# Patient Record
Sex: Female | Born: 1980 | Race: Black or African American | Hispanic: No | Marital: Single | Smoking: Never smoker
Health system: Southern US, Community
[De-identification: ages and names within clinical notes are randomized; demographics above are authoritative.]

## PROBLEM LIST (undated history)

## (undated) DIAGNOSIS — E785 Hyperlipidemia, unspecified: Secondary | ICD-10-CM

## (undated) DIAGNOSIS — F411 Generalized anxiety disorder: Secondary | ICD-10-CM

## (undated) DIAGNOSIS — G43009 Migraine without aura, not intractable, without status migrainosus: Secondary | ICD-10-CM

## (undated) DIAGNOSIS — O24419 Gestational diabetes mellitus in pregnancy, unspecified control: Secondary | ICD-10-CM

## (undated) DIAGNOSIS — F329 Major depressive disorder, single episode, unspecified: Secondary | ICD-10-CM

## (undated) DIAGNOSIS — T8859XA Other complications of anesthesia, initial encounter: Secondary | ICD-10-CM

## (undated) DIAGNOSIS — E669 Obesity, unspecified: Secondary | ICD-10-CM

## (undated) DIAGNOSIS — R51 Headache: Secondary | ICD-10-CM

## (undated) HISTORY — PX: PILONIDAL CYST EXCISION: SHX744

## (undated) HISTORY — PX: GANGLION CYST EXCISION: SHX1691

## (undated) HISTORY — DX: Hyperlipidemia, unspecified: E78.5

## (undated) HISTORY — DX: Obesity, unspecified: E66.9

## (undated) HISTORY — DX: Other complications of anesthesia, initial encounter: T88.59XA

## (undated) HISTORY — PX: CARPAL TUNNEL RELEASE: SHX101

## (undated) HISTORY — DX: Major depressive disorder, single episode, unspecified: F32.9

## (undated) HISTORY — DX: Generalized anxiety disorder: F41.1

## (undated) HISTORY — DX: Gestational diabetes mellitus in pregnancy, unspecified control: O24.419

## (undated) HISTORY — DX: Headache: R51

## (undated) HISTORY — DX: Migraine without aura, not intractable, without status migrainosus: G43.009

---

## 1998-05-19 ENCOUNTER — Other Ambulatory Visit: Admission: RE | Admit: 1998-05-19 | Discharge: 1998-05-19 | Payer: Self-pay | Admitting: Gynecology

## 1998-11-11 ENCOUNTER — Other Ambulatory Visit: Admission: RE | Admit: 1998-11-11 | Discharge: 1998-11-11 | Payer: Self-pay | Admitting: Gynecology

## 1999-03-23 ENCOUNTER — Other Ambulatory Visit: Admission: RE | Admit: 1999-03-23 | Discharge: 1999-03-23 | Payer: Self-pay | Admitting: Gynecology

## 1999-10-19 ENCOUNTER — Other Ambulatory Visit: Admission: RE | Admit: 1999-10-19 | Discharge: 1999-10-19 | Payer: Self-pay | Admitting: Gynecology

## 1999-12-08 ENCOUNTER — Other Ambulatory Visit: Admission: RE | Admit: 1999-12-08 | Discharge: 1999-12-08 | Payer: Self-pay | Admitting: Gynecology

## 1999-12-08 ENCOUNTER — Encounter (INDEPENDENT_AMBULATORY_CARE_PROVIDER_SITE_OTHER): Payer: Self-pay | Admitting: Specialist

## 2000-05-30 ENCOUNTER — Other Ambulatory Visit: Admission: RE | Admit: 2000-05-30 | Discharge: 2000-05-30 | Payer: Self-pay | Admitting: Gynecology

## 2003-02-11 ENCOUNTER — Encounter: Payer: Self-pay | Admitting: Emergency Medicine

## 2003-02-11 ENCOUNTER — Emergency Department (HOSPITAL_COMMUNITY): Admission: EM | Admit: 2003-02-11 | Discharge: 2003-02-11 | Payer: Self-pay | Admitting: Emergency Medicine

## 2003-11-22 ENCOUNTER — Emergency Department (HOSPITAL_COMMUNITY): Admission: EM | Admit: 2003-11-22 | Discharge: 2003-11-22 | Payer: Self-pay | Admitting: Emergency Medicine

## 2004-02-06 ENCOUNTER — Encounter: Payer: Self-pay | Admitting: Internal Medicine

## 2004-02-06 LAB — CONVERTED CEMR LAB

## 2004-02-13 ENCOUNTER — Ambulatory Visit (HOSPITAL_COMMUNITY): Admission: RE | Admit: 2004-02-13 | Discharge: 2004-02-13 | Payer: Self-pay | Admitting: Internal Medicine

## 2004-12-01 ENCOUNTER — Emergency Department (HOSPITAL_COMMUNITY): Admission: EM | Admit: 2004-12-01 | Discharge: 2004-12-01 | Payer: Self-pay | Admitting: Emergency Medicine

## 2005-05-16 ENCOUNTER — Ambulatory Visit: Payer: Self-pay | Admitting: Internal Medicine

## 2005-09-26 ENCOUNTER — Other Ambulatory Visit: Admission: RE | Admit: 2005-09-26 | Discharge: 2005-09-26 | Payer: Self-pay | Admitting: Gynecology

## 2006-02-20 ENCOUNTER — Ambulatory Visit: Payer: Self-pay | Admitting: Internal Medicine

## 2006-03-16 ENCOUNTER — Ambulatory Visit: Payer: Self-pay | Admitting: Internal Medicine

## 2006-04-26 ENCOUNTER — Ambulatory Visit: Payer: Self-pay | Admitting: Internal Medicine

## 2006-07-13 ENCOUNTER — Ambulatory Visit: Payer: Self-pay | Admitting: Internal Medicine

## 2006-10-18 ENCOUNTER — Ambulatory Visit: Payer: Self-pay | Admitting: Internal Medicine

## 2007-03-21 DIAGNOSIS — G43009 Migraine without aura, not intractable, without status migrainosus: Secondary | ICD-10-CM

## 2007-03-21 DIAGNOSIS — B019 Varicella without complication: Secondary | ICD-10-CM | POA: Insufficient documentation

## 2007-03-21 HISTORY — DX: Migraine without aura, not intractable, without status migrainosus: G43.009

## 2007-03-24 ENCOUNTER — Encounter: Payer: Self-pay | Admitting: Internal Medicine

## 2007-03-24 DIAGNOSIS — Z872 Personal history of diseases of the skin and subcutaneous tissue: Secondary | ICD-10-CM | POA: Insufficient documentation

## 2007-03-24 DIAGNOSIS — F411 Generalized anxiety disorder: Secondary | ICD-10-CM | POA: Insufficient documentation

## 2007-03-24 DIAGNOSIS — F329 Major depressive disorder, single episode, unspecified: Secondary | ICD-10-CM

## 2007-03-24 DIAGNOSIS — F3289 Other specified depressive episodes: Secondary | ICD-10-CM

## 2007-03-24 DIAGNOSIS — L0591 Pilonidal cyst without abscess: Secondary | ICD-10-CM | POA: Insufficient documentation

## 2007-03-24 DIAGNOSIS — M545 Low back pain: Secondary | ICD-10-CM

## 2007-03-24 DIAGNOSIS — F32A Depression, unspecified: Secondary | ICD-10-CM | POA: Insufficient documentation

## 2007-03-24 HISTORY — DX: Other specified depressive episodes: F32.89

## 2007-03-24 HISTORY — DX: Generalized anxiety disorder: F41.1

## 2007-03-24 HISTORY — DX: Major depressive disorder, single episode, unspecified: F32.9

## 2007-04-12 ENCOUNTER — Encounter: Payer: Self-pay | Admitting: Internal Medicine

## 2007-04-12 ENCOUNTER — Ambulatory Visit: Payer: Self-pay | Admitting: Internal Medicine

## 2007-04-12 DIAGNOSIS — L732 Hidradenitis suppurativa: Secondary | ICD-10-CM

## 2007-11-13 ENCOUNTER — Ambulatory Visit: Payer: Self-pay | Admitting: Internal Medicine

## 2007-11-13 DIAGNOSIS — R079 Chest pain, unspecified: Secondary | ICD-10-CM

## 2007-11-13 DIAGNOSIS — R209 Unspecified disturbances of skin sensation: Secondary | ICD-10-CM | POA: Insufficient documentation

## 2007-11-13 DIAGNOSIS — R21 Rash and other nonspecific skin eruption: Secondary | ICD-10-CM

## 2007-11-13 LAB — CONVERTED CEMR LAB
BUN: 9 mg/dL (ref 6–23)
CO2: 29 meq/L (ref 19–32)
Calcium: 9.3 mg/dL (ref 8.4–10.5)
Chloride: 105 meq/L (ref 96–112)
Creatinine, Ser: 0.6 mg/dL (ref 0.4–1.2)
Folate: 8.2 ng/mL
TSH: 2.45 microintl units/mL (ref 0.35–5.50)

## 2007-11-14 ENCOUNTER — Encounter: Payer: Self-pay | Admitting: Internal Medicine

## 2007-11-19 ENCOUNTER — Ambulatory Visit: Payer: Self-pay | Admitting: Internal Medicine

## 2008-01-10 ENCOUNTER — Ambulatory Visit: Payer: Self-pay | Admitting: Internal Medicine

## 2008-03-24 ENCOUNTER — Ambulatory Visit: Payer: Self-pay | Admitting: Internal Medicine

## 2008-04-21 ENCOUNTER — Telehealth: Payer: Self-pay | Admitting: Internal Medicine

## 2009-01-23 ENCOUNTER — Ambulatory Visit: Payer: Self-pay | Admitting: Internal Medicine
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
## 2009-06-11 ENCOUNTER — Emergency Department (HOSPITAL_COMMUNITY): Admission: EM | Admit: 2009-06-11 | Discharge: 2009-06-11 | Payer: Self-pay | Admitting: Emergency Medicine

## 2009-07-30 ENCOUNTER — Telehealth: Payer: Self-pay | Admitting: Internal Medicine

## 2009-07-30 ENCOUNTER — Ambulatory Visit: Payer: Self-pay | Admitting: Internal Medicine

## 2009-07-30 LAB — CONVERTED CEMR LAB
Basophils Absolute: 0 10*3/uL (ref 0.0–0.1)
Basophils Relative: 0.4 % (ref 0.0–3.0)
Eosinophils Absolute: 0.1 10*3/uL (ref 0.0–0.7)
HCT: 37.6 % (ref 36.0–46.0)
Hemoglobin: 12.6 g/dL (ref 12.0–15.0)
Lymphs Abs: 2.3 10*3/uL (ref 0.7–4.0)
MCHC: 33.5 g/dL (ref 30.0–36.0)
MCV: 86.7 fL (ref 78.0–100.0)
Monocytes Absolute: 0.7 10*3/uL (ref 0.1–1.0)
Neutro Abs: 3.2 10*3/uL (ref 1.4–7.7)
RBC: 4.34 M/uL (ref 3.87–5.11)
RDW: 11.9 % (ref 11.5–14.6)

## 2009-08-03 ENCOUNTER — Encounter: Payer: Self-pay | Admitting: Internal Medicine

## 2009-12-15 ENCOUNTER — Ambulatory Visit: Payer: Self-pay | Admitting: Internal Medicine

## 2009-12-15 DIAGNOSIS — E66812 Obesity, class 2: Secondary | ICD-10-CM | POA: Insufficient documentation

## 2009-12-15 DIAGNOSIS — E669 Obesity, unspecified: Secondary | ICD-10-CM

## 2010-09-07 NOTE — Assessment & Plan Note (Signed)
Summary: SORE THROAT/ EAR PAIN /NWS   Vital Signs:  Patient profile:   30 year old female Height:      67 inches Weight:      220 pounds BMI:     34.58 O2 Sat:      97 % on Room air Temp:     98.2 degrees F oral Pulse rate:   93 / minute BP sitting:   100 / 70  (left arm) Cuff size:   regular  Vitals Entered By: Bill Salinas CMA (Dec 15, 2009 4:12 PM)  O2 Flow:  Room air CC: pt here with complaint of sore throat and pain in both ears, but states the pain is worse in her right ear. Pt also has had some nasal drainage/ ab   Primary Care Provider:  [facility]  CC:  pt here with complaint of sore throat and pain in both ears and but states the pain is worse in her right ear. Pt also has had some nasal drainage/ ab.  History of Present Illness: Patient presents for 1 week h/o sore throat, ear pain bilaterally worse on the right. She has not had chills or documented fever. Nasal drainage is clear but she has had greenish Wiersma sputum.  No hearing loss but she will  have mild tinnintis. She has been taking allergy medicine - possibly with a decongestant.  She has been trying to loose weight with modified diet and walking. But, she has gained 10 lbs since Feb.  She has several small, 2mm pustules at the waist line and the inner thighs.   Current Medications (verified): 1)  Aleve 220 Mg  Tabs (Naproxen Sodium) .... Take 2 Tablet By Mouth Once A Day As Needed 2)  Meloxicam 15 Mg  Tabs (Meloxicam) .Marland Kitchen.. 1 By Mouth Daily For Back and Neck Problems  Allergies (verified): No Known Drug Allergies  Past History:  Past Medical History: Last updated: 03/24/2007 Headache Chicken Pox Anxiety Depression Low back pain  Past Surgical History: Last updated: 03/24/2007 S/P Pilonidal Cyst S/P Ganglion Cyst, L Wrist  Family History: Last updated: 11/13/2007 mother - '47: back surgery, DM, HTN, Lipids, CAD father - '44: HTN, Lipids, CAD PGM- breast cancer Neg- colon cancer  Social  History: Last updated: 11/13/2007 HSG, 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  The patient denies anorexia, fever, weight loss, chest pain, dyspnea on exertion, peripheral edema, prolonged cough, hemoptysis, abdominal pain, hematochezia, incontinence, suspicious skin lesions, difficulty walking, unusual weight change, abnormal bleeding, and angioedema.    Physical Exam  General:  overweight AA female in no distress Head:  no sinus tenderness to percussion. Tender to palpation over the right TMJ. Eyes:  corneas and lenses clear and no injection.   Ears:  EACs TMs normal Mouth:  posterior pharynx clear Neck:  supple and no thyromegaly.   Lungs:  normal respiratory effort, normal breath sounds, no crackles, and no wheezes.   Heart:  normal rate, regular rhythm, no murmur, no gallop, and no JVD.   Msk:  normal ROM, no joint tenderness, and no joint swelling.   Pulses:  2+ radial pulses Neurologic:  alert & oriented X3, cranial nerves II-XII intact, strength normal in all extremities, and gait normal.     Impression & Recommendations:  Problem # 1:  SKIN RASH (ICD-782.1) Probable folliculitis   Plan - use of anti-bacterial soap  cover small pustules with bandaid to prevent abrasion  Problem # 2:  VIRAL URI (ICD-465.9) No evidence of bacterial infection.  Pln - supportive care         otc anithistamines  see pt instr.  Her updated medication list for this problem includes:    Aleve 220 Mg Tabs (Naproxen sodium) .Marland Kitchen... Take 2 tablet by mouth once a day as needed    Meloxicam 15 Mg Tabs (Meloxicam) .Marland Kitchen... 1 by mouth daily for back and neck problems  Problem # 3:  OVERWEIGHT (ICD-278.02) Patient c/o inability to loose weight. She does drink sweetened beverages, carbs.  Plan - weight management with smart food choices- no sweetened drinks, low carbs; portion size;  continued exercise.            She may keep a food diary and perform a 5 day calorie count.           goal - 1 lb weight loss per month.  Complete Medication List: 1)  Aleve 220 Mg Tabs (Naproxen sodium) .... Take 2 tablet by mouth once a day as needed 2)  Meloxicam 15 Mg Tabs (Meloxicam) .Marland Kitchen.. 1 by mouth daily for back and neck problems  Patient Instructions: 1)  Viral Upper respiratory infection with pressure in the sinuses and drainage. No need for antibiotics. Plan - loratadine 10mg  once a day; generic sudafed 30mg  three times a day; gargle of choice. 2)  Ear pain right - tender over the temporal-mandibular joint (TMJ). Plan - small bites, don't chew gum; use aspercreme over the outside of the joint; motrin or aleve as directed. 3)  Folliculitis - small infected hair follicules where there is chaffing. Plan - warm soaks or baths washing with antibacterial soap and washcloth. Cover the small putules with a spot bandaid to prevent chaffing. For inner thighs - cotton clothins, minimize chaffing. 4)  Weight management - smart food choice, portion size control - use your hand, regular exercise. For extra precision - do a calorie count. Also  - no sweetened beverages.

## 2012-02-17 ENCOUNTER — Encounter (HOSPITAL_BASED_OUTPATIENT_CLINIC_OR_DEPARTMENT_OTHER): Payer: Self-pay | Admitting: Emergency Medicine

## 2012-02-17 ENCOUNTER — Emergency Department (HOSPITAL_BASED_OUTPATIENT_CLINIC_OR_DEPARTMENT_OTHER)
Admission: EM | Admit: 2012-02-17 | Discharge: 2012-02-17 | Disposition: A | Discharge status: Home / Self Care | Payer: Self-pay | Attending: Emergency Medicine | Admitting: Emergency Medicine

## 2012-02-17 DIAGNOSIS — Z3009 Encounter for other general counseling and advice on contraception: Secondary | ICD-10-CM | POA: Insufficient documentation

## 2012-02-17 DIAGNOSIS — T192XXA Foreign body in vulva and vagina, initial encounter: Secondary | ICD-10-CM

## 2012-02-17 NOTE — ED Provider Notes (Signed)
History     CSN: [id number]  Arrival date & time 02/17/12  4098   First MD Initiated Contact with Patient 02/17/12 [phone]      Chief Complaint  Patient presents with  . Foreign Body in Vagina    (Consider location/radiation/quality/duration/timing/severity/associated sxs/prior treatment) HPI Pt reports she inserted a contraceptive sponge in her vagina approx 48hrs ago and was unable to remove it yesterday. Complaining of some vaginal bleeding and soreness but no fever, discharge or vomiting.  History reviewed. No pertinent past medical history.  History reviewed. No pertinent past surgical history.  No family history on file.  History  Substance Use Topics  . Smoking status: Never Smoker   . Smokeless tobacco: Not on file  . Alcohol Use: No    OB History    Grav Para Term Preterm Abortions TAB SAB Ect Mult Living                  Review of Systems All other systems reviewed and are negative except as noted in HPI.   Allergies  Review of patient's allergies indicates no known allergies.  Home Medications  No current outpatient prescriptions on file.  BP 133/83  Pulse 88  Temp 98 F (36.7 C) (Oral)  Resp 18  SpO2 100%  Physical Exam  Nursing note and vitals reviewed. Constitutional: She is oriented to person, place, and time. She appears well-developed and well-nourished.  HENT:  Head: Normocephalic and atraumatic.  Eyes: EOM are normal. Pupils are equal, round, and reactive to light.  Neck: Normal range of motion. Neck supple.  Cardiovascular: Normal rate, normal heart sounds and intact distal pulses.   Pulmonary/Chest: Effort normal and breath sounds normal.  Abdominal: Bowel sounds are normal. She exhibits no distension. There is no tenderness.  Genitourinary: No bleeding around the vagina. There is a foreign body around the vagina. Vaginal discharge found.  Musculoskeletal: Normal range of motion. She exhibits no edema and no tenderness.  Neurological:  She is alert and oriented to person, place, and time. She has normal strength. No cranial nerve deficit or sensory deficit.  Skin: Skin is warm and dry. No rash noted.  Psychiatric: She has a normal mood and affect.    ED Course  Procedures (including critical care time)  Labs Reviewed - No data to display No results found.   No diagnosis found.    MDM  Sponge foreign body removed from vagina with ring forceps, will hold off on antibiotics since foreign body was removed intact. Advised to return to the ED for worsening pain, bleeding, discharge fever or for any other concerns.        Charles B. Bernette Mayers, MD 02/17/12 4782

## 2012-02-17 NOTE — ED Notes (Signed)
Foreign body removed from vagina by Dr. Bernette Mayers intact.

## 2012-02-17 NOTE — ED Notes (Signed)
Pt has a Today's Sponge in vagina and is unable to remove

## 2012-03-30 ENCOUNTER — Ambulatory Visit (INDEPENDENT_AMBULATORY_CARE_PROVIDER_SITE_OTHER): Payer: Self-pay | Admitting: Internal Medicine

## 2012-03-30 ENCOUNTER — Encounter: Payer: Self-pay | Admitting: Internal Medicine

## 2012-03-30 VITALS — BP 100/68 | HR 94 | Temp 97.1°F | Ht 67.0 in | Wt 235.0 lb

## 2012-03-30 DIAGNOSIS — J019 Acute sinusitis, unspecified: Secondary | ICD-10-CM

## 2012-03-30 MED ORDER — CEPHALEXIN 500 MG PO CAPS: 500.0000 mg | ORAL_CAPSULE | Freq: Four times a day (QID) | ORAL | Status: AC

## 2012-03-30 MED ORDER — HYDROCODONE-HOMATROPINE 5-1.5 MG/5ML PO SYRP: 5.0000 mL | ORAL_SOLUTION | Freq: Four times a day (QID) | ORAL | Status: AC | PRN

## 2012-03-30 NOTE — Patient Instructions (Addendum)
Take all new medications as prescribed Continue all other medications as before You can also take  Mucinex (or it's generic off brand) for congestion  

## 2012-03-30 NOTE — Assessment & Plan Note (Signed)
Mild to mod, for antibx course,  to f/u any worsening symptoms or concerns 

## 2012-03-30 NOTE — Progress Notes (Signed)
  Subjective:    Patient ID: Kathleen Hendricks, female    DOB: [birth year], 31 y.o.   MRN: [id number]  HPI   Here with 3 days acute onset fever, facial pain, pressure, general weakness and malaise, and greenish d/c, with slight ST, but with signfiacnt non prod cough and Pt denies chest pain, increased sob or doe, wheezing, orthopnea, PND, increased LE swelling, palpitations, dizziness or syncope.   Past Medical History  Diagnosis Date  . DEPRESSION 03/24/2007    Qualifier: Diagnosis of  By: Jonny Ruiz MD, Len Blalock   . ANXIETY 03/24/2007    Qualifier: Diagnosis of  By: Jonny Ruiz MD, Len Blalock    No past surgical history on file.  reports that she has never smoked. She does not have any smokeless tobacco history on file. She reports that she does not drink alcohol or use illicit drugs. family history is not on file. No Known Allergies No current outpatient prescriptions on file prior to visit.   Review of Systems All otherwise neg per pt     Objective:   Physical Exam BP 100/68  Pulse 94  Temp 97.1 F (36.2 C) (Oral)  Ht 5\' 7"  (1.702 m)  Wt 235 lb (106.595 kg)  BMI 36.81 kg/m2  SpO2 99% Physical Exam  VS noted Constitutional: Pt appears well-developed and well-nourished.  HENT: Head: Normocephalic.  Right Ear: External ear normal.  Left Ear: External ear normal.  Bilat tm's mild erythema.  Sinus tender.  Pharynx mild erythema Eyes: Conjunctivae and EOM are normal. Pupils are equal, round, and reactive to light.  Neck: Normal range of motion. Neck supple.  Cardiovascular: Normal rate and regular rhythm.   Pulmonary/Chest: Effort normal and breath sounds normal.  Skin: Skin is warm. No erythema.  Psychiatric:1+ nervou    Assessment & Plan:

## 2012-06-05 ENCOUNTER — Ambulatory Visit (INDEPENDENT_AMBULATORY_CARE_PROVIDER_SITE_OTHER): Payer: Self-pay | Admitting: Internal Medicine

## 2012-06-05 ENCOUNTER — Encounter: Payer: Self-pay | Admitting: Internal Medicine

## 2012-06-05 VITALS — BP 100/70 | HR 86 | Temp 98.0°F | Resp 16 | Wt 228.0 lb

## 2012-06-05 DIAGNOSIS — Z0000 Encounter for general adult medical examination without abnormal findings: Secondary | ICD-10-CM

## 2012-06-05 DIAGNOSIS — E669 Obesity, unspecified: Secondary | ICD-10-CM

## 2012-06-05 DIAGNOSIS — L300 Nummular dermatitis: Secondary | ICD-10-CM

## 2012-06-05 DIAGNOSIS — L259 Unspecified contact dermatitis, unspecified cause: Secondary | ICD-10-CM

## 2012-06-05 DIAGNOSIS — Z0001 Encounter for general adult medical examination with abnormal findings: Secondary | ICD-10-CM | POA: Insufficient documentation

## 2012-06-05 MED ORDER — CLOTRIMAZOLE-BETAMETHASONE 1-0.05 % EX CREA: TOPICAL_CREAM | Freq: Two times a day (BID) | CUTANEOUS | Status: DC

## 2012-06-05 NOTE — Progress Notes (Signed)
  Subjective:    Patient ID: Kathleen Hendricks, female    DOB: 1980-08-31, 31 y.o.   MRN: [id number]  HPI Kathleen Hendricks presents for evaluation of a patchy, dry, pruritic rash on her abdomen and thighs. This is a chronic problem. She has tried soaps and alcohol and other OTC treatments. The rash is a little large and darker on her thighs. There has been no drainage. No significant family  h/o eczema, asthma, atopic dermatitis, diabetes.  Past Medical History  Diagnosis Date  . DEPRESSION 03/24/2007    Qualifier: Diagnosis of  By: Kathleen Ruiz MD, Kathleen Hendricks   . ANXIETY 03/24/2007    Qualifier: Diagnosis of  By: Kathleen Ruiz MD, Kathleen Hendricks   . Headache   . Obesity (BMI 35.0-39.9 without comorbidity)    Past Surgical History  Procedure Date  . Ganglion cyst excision     left wrist  . Pilonidal cyst excision    Family History  Problem Relation Age of Onset  . Diabetes Mother   . Hypertension Mother   . Hyperlipidemia Mother   . Heart disease Mother     CAD  . Arthritis Mother     back surgery  . Hypertension Father   . Hyperlipidemia Father   . Heart disease Father     CAD  . Cancer Paternal Grandmother     breast   History   Social History  . Marital Status: Single    Spouse Name: N/A    Number of Children: N/A  . Years of Education: 51   Occupational History  . Not on file.   Social History Main Topics  . Smoking status: Never Smoker   . Smokeless tobacco: Never Used  . Alcohol Use: No  . Drug Use: No  . Sexually Active: Not on file   Other Topics Concern  . Not on file   Social History Narrative   HSG, [employer] - BA. Some grad school.  Single. Lives alone. Works - [employer]   No current outpatient prescriptions on file prior to visit.       Review of Systems System review is negative for any constitutional, cardiac, pulmonary, GI or neuro symptoms or complaints other than as described in the HPI.     Objective:   Physical Exam Filed Vitals:   06/05/12 1640   BP: 100/70  Pulse: 86  Temp: 98 F (36.7 C)  Resp: 16   Wt Readings from Last 3 Encounters:  06/05/12 228 lb (103.42 kg)  03/30/12 235 lb (106.595 kg)  12/15/09 220 lb (99.791 kg)   Gen'l- overweight white AA woman in no distress HEENT- C&S clear Cor- RRR Pulm - normal respirations Derm - abdomen with multiple 1 to 2 cm dark, dry patches without exfoliation. Thighs not examined.        Assessment & Plan:  Nummular eczema vs tinea corporis  Plan - lotrisone cream applied twice a day  For lack of response - biopsy vs derm consult

## 2012-06-05 NOTE — Patient Instructions (Addendum)
Skin rash - nummular eczema vs a fungal infection of the skin.   Plan - generic Lotrisone cream (combination of antifungal and steroid) - apply just a dab to each lesion twice a day.   If no improvement can either do a biopsy or refer to dermatology.    Health maintenance - orders are in for lab work. Come by any morning fasting for blood draw. Lab opens at 7:30

## 2012-06-05 NOTE — Assessment & Plan Note (Signed)
Patient is working on [employer] and has lost 17 lbs over the past year. She is following a low calorie diet with portion size control.  Plan - To keep up the good work.

## 2012-06-05 NOTE — Assessment & Plan Note (Signed)
She reports she is current with Gyn. Last labs in 2010  Plan  Routine lab: lipid, Bmet, CBC, hepatic.

## 2012-06-14 ENCOUNTER — Other Ambulatory Visit (INDEPENDENT_AMBULATORY_CARE_PROVIDER_SITE_OTHER): Payer: Self-pay

## 2012-06-14 DIAGNOSIS — Z0000 Encounter for general adult medical examination without abnormal findings: Secondary | ICD-10-CM

## 2012-06-14 LAB — LIPID PANEL
Cholesterol: 176 mg/dL (ref 0–200)
LDL Cholesterol: 127 mg/dL — ABNORMAL HIGH (ref 0–99)
Triglycerides: 43 mg/dL (ref 0.0–149.0)
VLDL: 8.6 mg/dL (ref 0.0–40.0)

## 2012-06-14 LAB — COMPREHENSIVE METABOLIC PANEL
ALT: 32 U/L (ref 0–35)
AST: 24 U/L (ref 0–37)
Albumin: 3.9 g/dL (ref 3.5–5.2)
Alkaline Phosphatase: 70 U/L (ref 39–117)
BUN: 7 mg/dL (ref 6–23)
CO2: 26 mEq/L (ref 19–32)
Calcium: 9.2 mg/dL (ref 8.4–10.5)
Chloride: 105 mEq/L (ref 96–112)
Creatinine, Ser: 0.7 mg/dL (ref 0.4–1.2)
GFR: 123.42 mL/min (ref >60.00)
Glucose, Bld: 87 mg/dL (ref 70–99)
Potassium: 4 mEq/L (ref 3.5–5.1)
Sodium: 138 mEq/L (ref 135–145)
Total Bilirubin: 0.6 mg/dL (ref 0.3–1.2)
Total Protein: 7.5 g/dL (ref 6.0–8.3)

## 2012-06-14 LAB — HEPATIC FUNCTION PANEL
Alkaline Phosphatase: 70 U/L (ref 39–117)
Bilirubin, Direct: 0.1 mg/dL (ref 0.0–0.3)
Total Bilirubin: 0.6 mg/dL (ref 0.3–1.2)
Total Protein: 7.5 g/dL (ref 6.0–8.3)

## 2012-06-14 LAB — CBC WITH DIFFERENTIAL/PLATELET
Basophils Absolute: 0 10*3/uL (ref 0.0–0.1)
Eosinophils Relative: 3.4 % (ref 0.0–5.0)
HCT: 39.5 % (ref 36.0–46.0)
Hemoglobin: 13.1 g/dL (ref 12.0–15.0)
MCHC: 33.3 g/dL (ref 30.0–36.0)
MCV: 85.4 fl (ref 78.0–100.0)
Monocytes Relative: 10.2 % (ref 3.0–12.0)
RBC: 4.62 Mil/uL (ref 3.87–5.11)

## 2012-06-18 ENCOUNTER — Encounter: Payer: Self-pay | Admitting: Internal Medicine

## 2012-09-28 ENCOUNTER — Encounter: Payer: Self-pay | Admitting: Internal Medicine

## 2012-09-28 ENCOUNTER — Ambulatory Visit (INDEPENDENT_AMBULATORY_CARE_PROVIDER_SITE_OTHER): Payer: Self-pay | Admitting: Internal Medicine

## 2012-09-28 VITALS — BP 108/68 | HR 85 | Temp 99.2°F | Ht 67.0 in | Wt 216.8 lb

## 2012-09-28 DIAGNOSIS — J069 Acute upper respiratory infection, unspecified: Secondary | ICD-10-CM

## 2012-09-28 DIAGNOSIS — B49 Unspecified mycosis: Secondary | ICD-10-CM

## 2012-09-28 DIAGNOSIS — T3695XA Adverse effect of unspecified systemic antibiotic, initial encounter: Secondary | ICD-10-CM

## 2012-09-28 DIAGNOSIS — R05 Cough: Secondary | ICD-10-CM

## 2012-09-28 MED ORDER — HYDROCODONE-HOMATROPINE 5-1.5 MG/5ML PO SYRP: 5.0000 mL | ORAL_SOLUTION | Freq: Three times a day (TID) | ORAL | Status: DC | PRN

## 2012-09-28 MED ORDER — AZITHROMYCIN 250 MG PO TABS: ORAL_TABLET | ORAL | Status: DC

## 2012-09-28 MED ORDER — FLUCONAZOLE 150 MG PO TABS: 150.0000 mg | ORAL_TABLET | Freq: Once | ORAL | Status: DC

## 2012-09-28 NOTE — Patient Instructions (Signed)

## 2012-09-28 NOTE — Progress Notes (Signed)
HPI  Pt presents to the clinic today with c/o cold symptoms x 2 weeks. The worst part is the sore throat and dry cough. She does not produce any sputum. She has ran fevers. She has tried Robitussin, Mucinex, cough drops and nothing seems to help. The cough is worse at night. She has not had much sleep in 2 nights. She does have a history of allergies but no asthma. She does have sick contacts.  Review of Systems      Past Medical History  Diagnosis Date  . DEPRESSION 03/24/2007    Qualifier: Diagnosis of  By: Jonny Ruiz MD, Len Blalock   . ANXIETY 03/24/2007    Qualifier: Diagnosis of  By: Jonny Ruiz MD, Len Blalock   . Headache   . Obesity (BMI 35.0-39.9 without comorbidity)     Family History  Problem Relation Age of Onset  . Diabetes Mother   . Hypertension Mother   . Hyperlipidemia Mother   . Heart disease Mother     CAD  . Arthritis Mother     back surgery  . Hypertension Father   . Hyperlipidemia Father   . Heart disease Father     CAD  . Cancer Paternal Grandmother     breast    History   Social History  . Marital Status: Single    Spouse Name: N/A    Number of Children: N/A  . Years of Education: 76   Occupational History  . Not on file.   Social History Main Topics  . Smoking status: Never Smoker   . Smokeless tobacco: Never Used  . Alcohol Use: No  . Drug Use: No  . Sexually Active: Not on file   Other Topics Concern  . Not on file   Social History Narrative   HSG, [employer] - BA. Some grad school.  Single. Lives alone. Works - [employer]    No Known Allergies   Constitutional: Positive headache, fatigue and fever. Denies abrupt weight changes.  HEENT:  Positive sore throat. Denies eye redness, eye pain, pressure behind the eyes, facial pain, nasal congestion, ear pain, ringing in the ears, wax buildup, runny nose or bloody nose. Respiratory: Positive cough. Denies difficulty breathing or shortness of breath.  Cardiovascular: Denies chest pain,  chest tightness, palpitations or swelling in the hands or feet.   No other specific complaints in a complete review of systems (except as listed in HPI above).  Objective:   BP 108/68  Pulse 85  Temp(Src) 99.2 F (37.3 C) (Oral)  Ht 5\' 7"  (1.702 m)  Wt 216 lb 12 oz (98.317 kg)  BMI 33.94 kg/m2  SpO2 97%  LMP 09/05/2012 Wt Readings from Last 3 Encounters:  09/28/12 216 lb 12 oz (98.317 kg)  06/05/12 228 lb (103.42 kg)  03/30/12 235 lb (106.595 kg)     General: Appears her stated age, well developed, well nourished in NAD. HEENT: Head: normal shape and size; Eyes: sclera white, no icterus, conjunctiva pink, PERRLA and EOMs intact; Ears: Tm's gray and intact, distorted light reflex; Nose: mucosa pink and moist, septum midline; Throat/Mouth: + PND. Teeth present, mucosa erythematous and moist, no exudate noted, no lesions or ulcerations noted.  Neck: Mild cervical lymphadenopathy. Neck supple, trachea midline. No massses, lumps or thyromegaly present.  Cardiovascular: Normal rate and rhythm. S1,S2 noted.  No murmur, rubs or gallops noted. No JVD or BLE edema. No carotid bruits noted. Pulmonary/Chest: Normal effort and positive vesicular breath sounds. No respiratory distress. No  wheezes, rales or ronchi noted.      Assessment & Plan:   Upper Respiratory Infection, new onset with additional workup required:  Get some rest and drink plenty of water Do salt water gargles for the sore throat eRx for Azithromax x 5 days eRx for Hycodan cough syrup  Antibiotic induced yeast infection:  Will give RX for diflucan if needed  RTC as needed or if symptoms persist.

## 2013-07-30 ENCOUNTER — Ambulatory Visit (INDEPENDENT_AMBULATORY_CARE_PROVIDER_SITE_OTHER)
Admission: RE | Admit: 2013-07-30 | Discharge: 2013-07-30 | Disposition: A | Discharge status: Home / Self Care | Payer: BC Managed Care – PPO | Source: Ambulatory Visit | Attending: Internal Medicine | Admitting: Internal Medicine

## 2013-07-30 ENCOUNTER — Encounter: Payer: Self-pay | Admitting: Internal Medicine

## 2013-07-30 ENCOUNTER — Ambulatory Visit (INDEPENDENT_AMBULATORY_CARE_PROVIDER_SITE_OTHER): Payer: BC Managed Care – PPO | Admitting: Internal Medicine

## 2013-07-30 VITALS — BP 134/92 | HR 80 | Temp 98.1°F | Wt 224.8 lb

## 2013-07-30 DIAGNOSIS — R209 Unspecified disturbances of skin sensation: Secondary | ICD-10-CM

## 2013-07-30 DIAGNOSIS — R202 Paresthesia of skin: Secondary | ICD-10-CM

## 2013-07-30 DIAGNOSIS — M79601 Pain in right arm: Secondary | ICD-10-CM

## 2013-07-30 DIAGNOSIS — M545 Low back pain, unspecified: Secondary | ICD-10-CM

## 2013-07-30 DIAGNOSIS — M79609 Pain in unspecified limb: Secondary | ICD-10-CM

## 2013-07-30 NOTE — Patient Instructions (Signed)
1. Low back and leg pain - exam is normal without any evidence of a pinched nerve or spinal cord. Still need to worry about disk disease or pressure on the spinal cord.  Plan X-ray of the lumbar spine  If x-rays totally normal may need Neurology consults  2. Tingling and weakness in the arms, right more than left. Exam is negative for pinched nerve signs or carpal tunnel syndrom  Plan Neck x-rays looking for disk disease  If normal may need neurology consult  3. Headach - may be migraine variant  Plan Trial of migraine medication -  If it works it makes the diagnosis.

## 2013-07-30 NOTE — Progress Notes (Signed)
Pre visit review using our clinic review tool, if applicable. No additional management support is needed unless otherwise documented below in the visit note. 

## 2013-07-30 NOTE — Progress Notes (Signed)
Subjective:    Patient ID: Kathleen Hendricks, female    DOB: [birth year], 32 y.o.   MRN: [id number]  HPI Low back pain - off and on for over a year but worse over the last 6 months. Standing at the sink hurts, hurts in AM when getting out of bed. She has developed paresthesia of both legs when sitting for a prolonged period of time, i.e. Using the commode. No loss of strength. No injury or strain recollected.   She does report that when sleeping or laying down or reaching out she will have paresthesia and difficulty coordinating meaningful movement. It is bilateral but more right UE.Marland Kitchen She does have some left sided neck pain.   Her headaches are more frequent and more intense. The distribution is either left or right frontal area. No facial paresthesia, no double vision, no abnormal smells. No prodroma. Headache will awaken her from sleep. No vomiting but a little nausea. Intermittently responds to NSAIDs. Predominant feeling of head exploding. She has not use 5HT drug for years Past Medical History  Diagnosis Date  . DEPRESSION 03/24/2007    Qualifier: Diagnosis of  By: Jonny Ruiz MD, Len Blalock   . ANXIETY 03/24/2007    Qualifier: Diagnosis of  By: Jonny Ruiz MD, Danella Sensing)   . Obesity (BMI 35.0-39.9 without comorbidity)    Past Surgical History  Procedure Laterality Date  . Ganglion cyst excision      left wrist  . Pilonidal cyst excision     Family History  Problem Relation Age of Onset  . Diabetes Mother   . Hypertension Mother   . Hyperlipidemia Mother   . Heart disease Mother     CAD  . Arthritis Mother     back surgery  . Hypertension Father   . Hyperlipidemia Father   . Heart disease Father     CAD  . Cancer Paternal Grandmother     breast   History   Social History  . Marital Status: Single    Spouse Name: N/A    Number of Children: N/A  . Years of Education: 74   Occupational History  . Not on file.   Social History Main Topics  . Smoking status: Never  Smoker   . Smokeless tobacco: Never Used  . Alcohol Use: No  . Drug Use: No  . Sexual Activity: Not on file   Other Topics Concern  . Not on file   Social History Narrative   HSG, [employer] - BA. Some grad school.  Single. Lives alone. Works - [employer]    No current outpatient prescriptions on file prior to visit.   No current facility-administered medications on file prior to visit.      Review of Systems System review is negative for any constitutional, cardiac, pulmonary, GI or neuro symptoms or complaints other than as described in the HPI.     Objective:   Physical Exam Filed Vitals:   07/30/13 1128  BP: 134/92  Pulse: 80  Temp: 98.1 F (36.7 C)   Wt Readings from Last 3 Encounters:  07/30/13 224 lb 12.8 oz (101.969 kg)  09/28/12 216 lb 12 oz (98.317 kg)  06/05/12 228 lb (103.42 kg)   Gen'l- overweight woman in no acute distress HEENT - normal Cor- RRR Pulm - normal respirations Neuro - A&O x 3, speech clear, cognition normal. CN II-XII - normal facial symmetry, PERRLA EOMI, normal shrug. MS - 5/5/ grip and UE strength,  5/5 LE strength. DTRs - 1`+ at patellar tendon, 2+ at biceps. No tremor, normal joint movement. Normal sensation to light touch, pin-prick and vibration UE and LE. Negative Tinel's and Phalen's sigh. MSK. - Back exam: normal stand; normal flex to greater than 100 degrees; normal gait; normal toe/heel walk; normal step up to exam table; normal SLR sitting; normal DTRs at the patellar tendons;  no  CVA tenderness; able to move supine to sitting wihout assistance.  Cervical spine x-rays: FINDINGS:  There is no evidence of cervical spine fracture or prevertebral soft  tissue swelling. Alignment is normal. No other significant bone  abnormalities are identified.  IMPRESSION:  Negative cervical spine radiographs.  Lumbar spine x-rays: COMPARISON: 11/13/2007  FINDINGS:  There is no evidence of lumbar spine fracture. Alignment is  normal.  Intervertebral disc spaces are maintained.  IMPRESSION:  Negative.       Assessment & Plan:  Paresthesias - patient with LE and UE paresthesia with no focal findings and c-spine and lumbar spine films negative for DDD.  Plan Refer to Neurology for possible demyelinating disease vs other.

## 2014-04-02 ENCOUNTER — Encounter: Payer: Self-pay | Admitting: Internal Medicine

## 2014-04-02 ENCOUNTER — Other Ambulatory Visit (INDEPENDENT_AMBULATORY_CARE_PROVIDER_SITE_OTHER): Payer: BC Managed Care – PPO

## 2014-04-02 ENCOUNTER — Ambulatory Visit (INDEPENDENT_AMBULATORY_CARE_PROVIDER_SITE_OTHER): Payer: BC Managed Care – PPO | Admitting: Internal Medicine

## 2014-04-02 VITALS — BP 120/78 | HR 84 | Temp 98.4°F | Ht 67.0 in | Wt 225.5 lb

## 2014-04-02 DIAGNOSIS — G43009 Migraine without aura, not intractable, without status migrainosus: Secondary | ICD-10-CM

## 2014-04-02 DIAGNOSIS — Z0000 Encounter for general adult medical examination without abnormal findings: Secondary | ICD-10-CM

## 2014-04-02 DIAGNOSIS — M545 Low back pain, unspecified: Secondary | ICD-10-CM

## 2014-04-02 DIAGNOSIS — M25519 Pain in unspecified shoulder: Secondary | ICD-10-CM

## 2014-04-02 DIAGNOSIS — R5381 Other malaise: Secondary | ICD-10-CM

## 2014-04-02 DIAGNOSIS — L0292 Furuncle, unspecified: Secondary | ICD-10-CM

## 2014-04-02 DIAGNOSIS — E785 Hyperlipidemia, unspecified: Secondary | ICD-10-CM

## 2014-04-02 DIAGNOSIS — R5383 Other fatigue: Secondary | ICD-10-CM | POA: Insufficient documentation

## 2014-04-02 DIAGNOSIS — Z23 Encounter for immunization: Secondary | ICD-10-CM

## 2014-04-02 DIAGNOSIS — G43019 Migraine without aura, intractable, without status migrainosus: Secondary | ICD-10-CM

## 2014-04-02 DIAGNOSIS — L0293 Carbuncle, unspecified: Secondary | ICD-10-CM | POA: Insufficient documentation

## 2014-04-02 DIAGNOSIS — M25511 Pain in right shoulder: Secondary | ICD-10-CM | POA: Insufficient documentation

## 2014-04-02 DIAGNOSIS — E669 Obesity, unspecified: Secondary | ICD-10-CM

## 2014-04-02 HISTORY — DX: Obesity, unspecified: E66.9

## 2014-04-02 HISTORY — DX: Hyperlipidemia, unspecified: E78.5

## 2014-04-02 LAB — CBC WITH DIFFERENTIAL/PLATELET
BASOS ABS: 0 10*3/uL (ref 0.0–0.1)
Basophils Relative: 0.6 % (ref 0.0–3.0)
Eosinophils Absolute: 0.2 10*3/uL (ref 0.0–0.7)
Eosinophils Relative: 3.1 % (ref 0.0–5.0)
HCT: 38.7 % (ref 36.0–46.0)
HEMOGLOBIN: 13 g/dL (ref 12.0–15.0)
LYMPHS PCT: 39.4 % (ref 12.0–46.0)
Lymphs Abs: 1.9 10*3/uL (ref 0.7–4.0)
MCHC: 33.5 g/dL (ref 30.0–36.0)
MCV: 85 fl (ref 78.0–100.0)
MONOS PCT: 7.4 % (ref 3.0–12.0)
Monocytes Absolute: 0.4 10*3/uL (ref 0.1–1.0)
Neutro Abs: 2.4 10*3/uL (ref 1.4–7.7)
Neutrophils Relative %: 49.5 % (ref 43.0–77.0)
Platelets: 356 10*3/uL (ref 150.0–400.0)
RBC: 4.55 Mil/uL (ref 3.87–5.11)
RDW: 13.4 % (ref 11.5–15.5)
WBC: 4.9 10*3/uL (ref 4.0–10.5)

## 2014-04-02 LAB — URINALYSIS, ROUTINE W REFLEX MICROSCOPIC
Bilirubin Urine: NEGATIVE
Ketones, ur: NEGATIVE
LEUKOCYTES UA: NEGATIVE
Nitrite: NEGATIVE
Specific Gravity, Urine: 1.025 (ref 1.000–1.030)
Total Protein, Urine: NEGATIVE
UROBILINOGEN UA: 0.2 (ref 0.0–1.0)
Urine Glucose: NEGATIVE
pH: 5.5 (ref 5.0–8.0)

## 2014-04-02 LAB — BASIC METABOLIC PANEL
BUN: 8 mg/dL (ref 6–23)
CALCIUM: 9.1 mg/dL (ref 8.4–10.5)
CO2: 24 meq/L (ref 19–32)
Chloride: 105 mEq/L (ref 96–112)
Creatinine, Ser: 0.7 mg/dL (ref 0.4–1.2)
GFR: 122.13 mL/min (ref >60.00)
GLUCOSE: 88 mg/dL (ref 70–99)
Potassium: 3.8 mEq/L (ref 3.5–5.1)
Sodium: 138 mEq/L (ref 135–145)

## 2014-04-02 LAB — TSH: TSH: 1.9 u[IU]/mL (ref 0.35–4.50)

## 2014-04-02 LAB — LIPID PANEL
CHOLESTEROL: 185 mg/dL (ref 0–200)
HDL: 45 mg/dL (ref >39.00)
LDL Cholesterol: 129 mg/dL — ABNORMAL HIGH (ref 0–99)
NonHDL: 140
TRIGLYCERIDES: 54 mg/dL (ref 0.0–149.0)
Total CHOL/HDL Ratio: 4
VLDL: 10.8 mg/dL (ref 0.0–40.0)

## 2014-04-02 LAB — HEPATIC FUNCTION PANEL
ALBUMIN: 3.9 g/dL (ref 3.5–5.2)
ALK PHOS: 70 U/L (ref 39–117)
ALT: 33 U/L (ref 0–35)
AST: 27 U/L (ref 0–37)
Bilirubin, Direct: 0 mg/dL (ref 0.0–0.3)
TOTAL PROTEIN: 7.8 g/dL (ref 6.0–8.3)
Total Bilirubin: 0.3 mg/dL (ref 0.2–1.2)

## 2014-04-02 MED ORDER — SUMATRIPTAN SUCCINATE 100 MG PO TABS: 100.0000 mg | ORAL_TABLET | ORAL | Status: DC | PRN

## 2014-04-02 MED ORDER — KETOROLAC TROMETHAMINE 30 MG/ML IJ SOLN
30.0000 mg | Freq: Once | INTRAMUSCULAR | Status: AC
  Administered 2014-04-02: 30 mg via INTRAMUSCULAR

## 2014-04-02 MED ORDER — NAPROXEN 500 MG PO TABS: 500.0000 mg | ORAL_TABLET | Freq: Two times a day (BID) | ORAL | Status: DC | PRN

## 2014-04-02 MED ORDER — TOPIRAMATE 50 MG PO TABS: 50.0000 mg | ORAL_TABLET | Freq: Two times a day (BID) | ORAL | Status: DC

## 2014-04-02 NOTE — Patient Instructions (Addendum)
You had the flu shot today  Please remember to followup with your GYN for the yearly pap smear  Please take all new medication as prescribed - the topamax daily (remember to start slowly as we discussed), the imitrex as needed for migraine, and the naproxen anti-inflammatory for shoulder and back pain  Please continue all other medications as before, and refills have been done if requested.  Please have the pharmacy call with any other refills you may need.  Please continue your efforts at being more active, low cholesterol diet, and weight control.  You are otherwise up to date with prevention measures today.  Please keep your appointments with your specialists as you may have planned  Please go to the LAB in the Basement (turn left off the elevator) for the tests to be done today  You will be contacted by phone if any changes need to be made immediately.  Otherwise, you will receive a letter about your results with an explanation, but please check with MyChart first.  Please remember to sign up for MyChart if you have not done so, as this will be important to you in the future with finding out test results, communicating by private email, and scheduling acute appointments online when needed.  Please return in 1 year for your yearly visit, or sooner if needed, with Lab testing done 3-5 days before

## 2014-04-02 NOTE — Assessment & Plan Note (Signed)
Etiology unclear, Exam otherwise benign, to check labs as documented, follow with expectant management  

## 2014-04-02 NOTE — Progress Notes (Signed)
Subjective:    Patient ID: Kathleen Hendricks, female    DOB: 06/21/1981, 33 y.o.   MRN: [id number]  HPI  Here for wellness and f/u;  Overall doing ok;  Pt denies CP, worsening SOB, DOE, wheezing, orthopnea, PND, worsening LE edema, palpitations, dizziness or syncope.  Pt denies neurological change such as new headache, facial or extremity weakness.  Pt denies polydipsia, polyuria, or low sugar symptoms. Pt states overall good compliance with treatment and medications, good tolerability, and has been trying to follow lower cholesterol diet.  Pt denies worsening depressive symptoms, suicidal ideation or panic. No fever, night sweats, wt loss, loss of appetite, or other constitutional symptoms.  Pt states good ability with ADL's, has low fall risk, home safety reviewed and adequate, no other significant changes in hearing or vision, and only occasionally active with exercise.  Sees optho yearly, intends to call GYN later today to set up pap. Asks for b12 level, Vit D.   Does c/o ongoing fatigue, but denies signficant daytime hypersomnolence.  Tends to get migraine at work only, wants to try preventive, also needs prn med. 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  Pt continues to have recurring LBP without change in severity, bowel or bladder change, fever, wt loss,  worsening LE pain/numbness/weakness, gait change or falls.  Wt Readings from Last 3 Encounters:  04/02/14 225 lb 8 oz (102.286 kg)  07/30/13 224 lb 12.8 oz (101.969 kg)  09/28/12 216 lb 12 oz (98.317 kg)   Past Medical History  Diagnosis Date  . DEPRESSION 03/24/2007    Qualifier: Diagnosis of  By: Jonny Ruiz MD, Len Blalock   . ANXIETY 03/24/2007    Qualifier: Diagnosis of  By: Jonny Ruiz MD, Danella Sensing)   . Obesity (BMI 35.0-39.9 without comorbidity)   . Other and unspecified hyperlipidemia 04/02/2014  . Common migraine 03/21/2007      Qualifier: Diagnosis of  By: Maris Berger    Past Surgical History  Procedure Laterality Date  . Ganglion cyst excision      left wrist  . Pilonidal cyst excision      reports that she has never smoked. She has never used smokeless tobacco. She reports that she does not drink alcohol or use illicit drugs. family history includes Arthritis in her mother; Cancer in her paternal grandmother; Diabetes in her mother; Heart disease in her father and mother; Hyperlipidemia in her father and mother; Hypertension in her father and mother. No Known Allergies No current outpatient prescriptions on file prior to visit.   No current facility-administered medications on file prior to visit.   Review of Systems Constitutional: Negative for increased diaphoresis, other activity, appetite or other siginficant weight change  HENT: Negative for worsening hearing loss, ear pain, facial swelling, mouth sores and neck stiffness.   Eyes: Negative for other worsening pain, redness or visual disturbance.  Respiratory: Negative for shortness of breath and wheezing.   Cardiovascular: Negative for chest pain and palpitations.  Gastrointestinal: Negative for diarrhea, blood in stool, abdominal distention or other pain Genitourinary: Negative for hematuria, flank pain or change in urine volume.  Musculoskeletal: Negative for myalgias or other joint complaints.  Skin: Negative for color change and wound.  Neurological: Negative for syncope and numbness. other than noted Hematological: Negative for adenopathy. or other swelling Psychiatric/Behavioral: Negative for hallucinations, self-injury, decreased concentration  or other worsening agitation.      Objective:   Physical Exam BP 120/78  Pulse 84  Temp(Src) 98.4 F (36.9 C) (Oral)  Ht  (1.702 m)  Wt 225 lb 8 oz (102.286 kg)  BMI 35.31 kg/m2  SpO2 96% VS noted,  Constitutional: Pt is oriented to person, place, and time. Appears  well-developed and well-nourished. Lavella Lemons Head: Normocephalic and atraumatic.  Right Ear: External ear normal.  Left Ear: External ear normal.  Nose: Nose normal.  Mouth/Throat: Oropharynx is clear and moist.  Eyes: Conjunctivae and EOM are normal. Pupils are equal, round, and reactive to light.  Neck: Normal range of motion. Neck supple. No JVD present. No tracheal deviation present.  Cardiovascular: Normal rate, regular rhythm, normal heart sounds and intact distal pulses.   Pulmonary/Chest: Effort normal and breath sounds without rales or wheezing  Abdominal: Soft. Bowel sounds are normal. NT. No HSM  Musculoskeletal: Normal range of motion. Exhibits no edema.  Lymphadenopathy:  Has no cervical adenopathy.  Neurological: Pt is alert and oriented to person, place, and time. Pt has normal reflexes. No cranial nerve deficit. Motor grossly intact Skin: Skin is warm and dry. No rash noted. left  Mid Breast approx 2 cm above areola  With healed < 1/2 cm abscess Psychiatric:  Has mild nervous mood and affect. Behavior is normal.  Right shoulder NT with FROM, minor pain with active ROM Spine nontender, no signficant paravertebral tender, swelling    Assessment & Plan:

## 2014-04-02 NOTE — Assessment & Plan Note (Signed)
For toradol IM today, then topamax, imitrex asd

## 2014-04-02 NOTE — Assessment & Plan Note (Signed)
C/w msk strain, possible tendonitis, mild, exam with minimal findings, for nsaid prn

## 2014-04-02 NOTE — Progress Notes (Signed)
Pre visit review using our clinic review tool, if applicable. No additional management support is needed unless otherwise documented below in the visit note. 

## 2014-04-02 NOTE — Assessment & Plan Note (Signed)
C.w msk strain, no neuro changes , for nsaid prn  to f/u any worsening symptoms or concerns

## 2014-04-02 NOTE — Addendum Note (Signed)
Addended by: Scharlene Gloss B on: 04/02/2014 09:59 AM   Modules accepted: Orders

## 2014-04-02 NOTE — Assessment & Plan Note (Signed)

## 2014-04-02 NOTE — Assessment & Plan Note (Signed)
None currently, to follow up for any worsening

## 2014-04-16 ENCOUNTER — Ambulatory Visit: Payer: BC Managed Care – PPO | Admitting: Internal Medicine

## 2014-05-15 ENCOUNTER — Encounter: Payer: Self-pay | Admitting: Family

## 2014-05-15 ENCOUNTER — Ambulatory Visit (INDEPENDENT_AMBULATORY_CARE_PROVIDER_SITE_OTHER): Payer: BC Managed Care – PPO | Admitting: Family

## 2014-05-15 ENCOUNTER — Telehealth: Payer: Self-pay

## 2014-05-15 VITALS — BP 116/70 | HR 77 | Temp 98.5°F | Ht 67.0 in | Wt 225.8 lb

## 2014-05-15 DIAGNOSIS — M5440 Lumbago with sciatica, unspecified side: Secondary | ICD-10-CM

## 2014-05-15 DIAGNOSIS — L0293 Carbuncle, unspecified: Secondary | ICD-10-CM

## 2014-05-15 DIAGNOSIS — G479 Sleep disorder, unspecified: Secondary | ICD-10-CM | POA: Diagnosis not present

## 2014-05-15 DIAGNOSIS — L304 Erythema intertrigo: Secondary | ICD-10-CM | POA: Insufficient documentation

## 2014-05-15 MED ORDER — MICONAZOLE NITRATE 2 % EX CREA: 1.0000 "application " | TOPICAL_CREAM | Freq: Two times a day (BID) | CUTANEOUS | Status: DC

## 2014-05-15 NOTE — Patient Instructions (Signed)
Thank you for choosing [facility].  Summary/Instructions:   Your prescription has been sent to your pharmacy  Referrals have been made to Sports Medicine and Dermatology. You should hear back from the office in about a week. If not please call and let us know.  For sleep try OTC benedryl or melatonin and follow the packaged instructions.    Intertrigo Intertrigo is a skin condition that occurs in between folds of skin in places on the body that rub together a lot and do not get much ventilation. It is caused by heat, moisture, friction, sweat retention, and lack of air circulation, which produces red, irritated patches and, sometimes, scaling or drainage. People who have diabetes, who are obese, or who have treatment with antibiotics are at increased risk for intertrigo. The most common sites for intertrigo to occur include:  The groin.  The breasts.  The armpits.  Folds of abdominal skin.  Webbed spaces between the fingers or toes. Intertrigo may be aggravated by:  Sweat.  Feces.  Yeast or bacteria that are present near skin folds.  Urine.  Vaginal discharge. HOME CARE INSTRUCTIONS  The following steps can be taken to reduce friction and keep the affected area cool and dry:  Expose skin folds to the air.  Keep deep skin folds separated with cotton or linen cloth. Avoid tight fitting clothing that could cause chafing.  Wear open-toed shoes or sandals to help reduce moisture between the toes.  Apply absorbent powders to affected areas as directed by your caregiver.  Apply over-the-counter barrier pastes, such as zinc oxide, as directed by your caregiver.  If you develop a fungal infection in the affected area, your caregiver may have you use antifungal creams. SEEK MEDICAL CARE IF:   The rash is not improving after 1 week of treatment.  The rash is getting worse (more red, more swollen, more painful, or spreading).  You have a fever or chills. MAKE  SURE YOU:   Understand these instructions.  Will watch your condition.  Will get help right away if you are not doing well or get worse. Document Released: 07/25/2005 Document Revised: 10/17/2011 Document Reviewed: 01/07/2010 [facility] Patient Information 2015 [employer] [place]. This information is not intended to replace advice given to you by your health care provider. Make sure you discuss any questions you have with your health care provider.

## 2014-05-15 NOTE — Assessment & Plan Note (Signed)
Discussed recurrent boils on inner thigh and under breasts. Referral to dermatology made.

## 2014-05-15 NOTE — Assessment & Plan Note (Signed)
Start topical miconazole x 4 weeks. Instructed to keep the area clean and dry throughout the day. Will return if worsens or fails to improve.

## 2014-05-15 NOTE — Assessment & Plan Note (Signed)
Discussed sleep hygiene. Suggested to follow regular schedule routine, decrease stimulation prior to sleep, limit caffeine. May try OTC remedy such as melatonin or Tylenol PM as needed.

## 2014-05-15 NOTE — Telephone Encounter (Signed)
Pa request for miconazole 2: top cream 28.4 gm, apply to the affected area bid for 3 weeks.  Pa initiated, approval pending

## 2014-05-15 NOTE — Assessment & Plan Note (Signed)
Describes increase in symptoms previously noted. Will refer to Sports Medicine.

## 2014-05-15 NOTE — Progress Notes (Signed)
Pre visit review using our clinic review tool, if applicable. No additional management support is needed unless otherwise documented below in the visit note. 

## 2014-05-15 NOTE — Telephone Encounter (Signed)
Pt called to check up on this request, please call pt of the status.

## 2014-05-15 NOTE — Progress Notes (Signed)
   Subjective:    Patient ID: Kathleen Hendricks, female    DOB: 09/06/1980, 33 y.o.   MRN: [id number]  HPI:  Kathleen Mayoramaine D Orsborn is a 33 y.o. female who presents today with several medical questions.   1) Discoloration under left breast -first noticed last night. Described as reddish-Umbach. Denies itchiness, painfulness. Denies any changes in soaps or detergents. Recently diagnosed strep throat and completed amoxicillin. Has not tried to any topical creams or other treatments  2) Recurrent boils - Describes recurrent boils under her breasts and on her inner thighs bilaterally. Denies redness inflammation or discharge. Previously referred to dermatology, but decline further treatment. Would like referral to dermatology.  3) Lower back pain - pain has been going on "forever". Was given anti-inflammatories which have not helped very much. Pain continues to be in center of her lumbar spine with some radiation down to her lower legs. Denies any changes in activity or new traumas to the area.   4) Difficulty sleeping - Indicates she has been having difficulty falling asleep. Has not tried any over the counter medications at this point.    Review of Systems     See HPI  Objective:     BP 116/70  Pulse 77  Temp(Src) 98.5 F (36.9 C) (Oral)  Ht 5\' 7"  (1.702 m)  Wt 225 lb 12.8 oz (102.422 kg)  BMI 35.36 kg/m2  SpO2 97% Nursing note and vital signs reviewed.  Physical Exam  Constitutional: She is oriented to person, place, and time. She appears well-developed and well-nourished. No distress.  Cardiovascular: Normal rate, regular rhythm and normal heart sounds.   Pulmonary/Chest: Effort normal and breath sounds normal.    Neurological: She is alert and oriented to person, place, and time.  Skin: Skin is warm and dry.  Psychiatric: She has a normal mood and affect. Her behavior is normal. Judgment and thought content normal.       Assessment & Plan:

## 2014-05-16 ENCOUNTER — Other Ambulatory Visit: Payer: Self-pay

## 2014-05-19 NOTE — Telephone Encounter (Signed)
Attempted to call pt and notify him that pa was not approved. Pt can get medication over the counter, unable to reach pt

## 2014-05-29 ENCOUNTER — Other Ambulatory Visit (INDEPENDENT_AMBULATORY_CARE_PROVIDER_SITE_OTHER): Payer: BC Managed Care – PPO

## 2014-05-29 ENCOUNTER — Encounter: Payer: Self-pay | Admitting: Family Medicine

## 2014-05-29 ENCOUNTER — Ambulatory Visit (INDEPENDENT_AMBULATORY_CARE_PROVIDER_SITE_OTHER): Payer: BC Managed Care – PPO | Admitting: Family Medicine

## 2014-05-29 VITALS — BP 92/66 | HR 74 | Ht 67.0 in | Wt 228.0 lb

## 2014-05-29 DIAGNOSIS — M25511 Pain in right shoulder: Secondary | ICD-10-CM

## 2014-05-29 DIAGNOSIS — M719 Bursopathy, unspecified: Secondary | ICD-10-CM

## 2014-05-29 DIAGNOSIS — M545 Low back pain, unspecified: Secondary | ICD-10-CM

## 2014-05-29 NOTE — Assessment & Plan Note (Signed)
Chronic, likely multifactorial with patient's poor core strength, decreased exercising, and likely some psychological component. Patient will try some over-the-counter medication, icing protocol, home exercises and we discussed proper shoe wear. We discussed proper lifting techniques. Patient will come back and see me again in 3-4 weeks for further evaluation. Patient's x-rays have been unremarkable. Do not feel that further imaging is necessary. Patient could do formal physical therapy if continuing to have pain.

## 2014-05-29 NOTE — Patient Instructions (Signed)
It is good to see you Kathleen Hendricks 20 minutes 2 times daily. Usually after activity and before bed. Exercises 3 times a week. Alternate daily back and shoulder.  Turmeric 500mg  twice daily Vitamin D 2000 IU daily Iron 325mg  daily. This may constipate you.  Come back again in 3 weeks.
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
## 2014-05-29 NOTE — Assessment & Plan Note (Signed)
Thank you to a subacromial bursitis. Patient was given an injection today. Discuss home exercises, icing protocol, as well as over-the-counter medications. Patient will then come back in 3 weeks for further evaluation.

## 2014-06-12 ENCOUNTER — Ambulatory Visit: Payer: BC Managed Care – PPO | Admitting: Internal Medicine

## 2014-06-19 ENCOUNTER — Encounter: Payer: Self-pay | Admitting: Family Medicine

## 2014-06-19 ENCOUNTER — Ambulatory Visit (INDEPENDENT_AMBULATORY_CARE_PROVIDER_SITE_OTHER): Payer: BC Managed Care – PPO | Admitting: Family Medicine

## 2014-06-19 VITALS — BP 98/62 | HR 75 | Ht 67.0 in | Wt 225.0 lb

## 2014-06-19 DIAGNOSIS — M545 Low back pain, unspecified: Secondary | ICD-10-CM

## 2014-06-19 DIAGNOSIS — M25511 Pain in right shoulder: Secondary | ICD-10-CM

## 2014-06-19 MED ORDER — DICLOFENAC SODIUM 2 % TD SOLN: 2.0000 "application " | Freq: Two times a day (BID) | TRANSDERMAL | Status: DC

## 2014-06-19 NOTE — Assessment & Plan Note (Signed)
Multifactorial. I do not believe the patient is doing the exercises on a regular basis. Encourage patient to do this and I think that formal physical therapy will be helpful. Discussed over-the-counter medications and how this can be beneficial in patient was given a prescription for topical anti-inflammatories. Patient will try to be more religious on the exercises. Patient and will come back and see me again in 4 weeks for further evaluation. Discussed the importance of weight loss.

## 2014-06-19 NOTE — Progress Notes (Signed)
Tawana ScaleZach Srihitha Tagliaferri D.O. [facility] [address] [place], [place] [address] Phone: [phone] Subjective:    I'm seeing this patient by the request  of:  Oliver BarreJames John, MD   CC: low back pain, right shoulder pain  ION:[id number]:Subjective Zenon Mayoramaine D Everding is a 33 y.o. female coming in with complaint of low back pain and right shoulder pain.  1. right shoulder pain- patient was seen previously was diagnosed with a subacromial bursitis. Patient states she is presently 70% better. Still having some mild discomfort with range of motion. Still when she reaches across her body or above her head she has some discomfort. States that when she sleeps on that side a can wake her up. She has noticed that she has taken less over-the-counter medicines. Denies any new symptoms.  2.  patient has had intermittent low back pain for significant amount of time. Patient has had workup including x-rays and 2006, 2009 as well as in 2014 which were all unremarkable.  Patient was diagnosed more with significant muscle imbalances and poor core strength. Patient was given home exercises and states that it seems to be better and decrease the frequency of the spasming that she was having previously. Denies any new symptoms such as radiation down her legs any numbness or tingling.     Past medical history, social, surgical and family history all reviewed in electronic medical record.   Review of Systems: No headache, visual changes, nausea, vomiting, diarrhea, constipation, dizziness, abdominal pain, skin rash, fevers, chills, night sweats, weight loss, swollen lymph nodes, body aches, joint swelling, muscle aches, chest pain, shortness of breath, mood changes.   Objective Blood pressure 98/62, pulse 75, height 5\' 7"  (1.702 m), weight 225 lb (102.059 kg), SpO2 98 %.  General: No apparent distress alert and oriented x3 mood and affect normal, dressed appropriately.  HEENT: Pupils equal, extraocular movements intact    Respiratory: Patient's speak in full sentences and does not appear short of breath  Cardiovascular: No lower extremity edema, non tender, no erythema  Skin: Warm dry intact with no signs of infection or rash on extremities or on axial skeleton.  Abdomen: Soft nontender  Neuro: Cranial nerves II through XII are intact, neurovascularly intact in all extremities with 2+ DTRs and 2+ pulses.  Lymph: No lymphadenopathy of posterior or anterior cervical chain or axillae bilaterally.  Gait normal with good balance and coordination.  MSK:  Non tender with full range of motion and good stability and symmetric strength and tone of  elbows, wrist, hip, knee and ankles bilaterally.  Shoulder: Right Inspection reveals no abnormalities, atrophy or asymmetry. Palpation is normal with no tenderness over AC joint or bicipital groove. ROM is full in all planes passively. Rotator cuff strength normal throughout. continuedsigns of impingement with positive Neer and Hawkin's tests, but negative empty can sign. Speeds and Yergason's tests normal. No labral pathology noted with negative Obrien's, negative clunk and good stability. Normal scapular function observed. No painful arc and no drop arm sign. No apprehension sign Collateral shoulder unremarkable  Back Exam:  Inspection: Unremarkable severely poor core strength Motion: Flexion 45 deg, Extension 45 deg, Side Bending to 45 deg bilaterally,  Rotation to 45 deg bilaterally  SLR laying: Negative  XSLR laying: Negative  Palpable tenderness: mild decrease in the tenderness of the paraspinal musculature but still present in the lumbar spine FABER: negative. Sensory change: Gross sensation intact to all lumbar and sacral dermatomes.  Reflexes: 2+ at both patellar tendons, 2+ at  achilles tendons, Babinski's downgoing.  Strength at foot  Plantar-flexion: 5/5 Dorsi-flexion: 5/5 Eversion: 5/5 Inversion: 5/5  Leg strength  Quad: 5/5 Hamstring: 5/5 Hip flexor:  5/5 Hip abductors: 4/5      Impression and Recommendations:     This case required medical decision making of moderate complexity.

## 2014-06-19 NOTE — Patient Instructions (Signed)
Good to see you Continue to do the exercises Try the pennsaid 2 times daily as needed Ice is still your friend at the end of the day.  Physical therapy will be calling you.  See me again in 4 weeks.

## 2014-06-19 NOTE — Assessment & Plan Note (Signed)
Patient did respond well to the injection with 70% decreased but still symptomatic. Discussed continuing the home exercises in the icing protocol. I would like to send patient to formal physical therapy. Topical anti-inflammatories were sent in for patient.patient and will come back again in 4 weeks for further evaluation and treatment.

## 2014-08-06 ENCOUNTER — Telehealth: Payer: Self-pay | Admitting: Internal Medicine

## 2014-08-06 NOTE — Telephone Encounter (Signed)
Pt called in with several questions about meds.  Requesting call back from nurse

## 2014-08-06 NOTE — Telephone Encounter (Signed)
Called the patient left a detailed message to call back 

## 2014-08-07 NOTE — Telephone Encounter (Signed)
Called the patient left a detailed message to call back and leave our office a detailed message of more specifics of her questions.

## 2014-08-21 ENCOUNTER — Telehealth: Payer: Self-pay | Admitting: Internal Medicine

## 2014-08-21 NOTE — Telephone Encounter (Signed)
Patient had appointment at Dr. Elease EtienneLuptons office today at 10:40am.  Patient was checked in at 10:34am.  Patient got upset in lobby b.c she was not gotten back soon.  Patient left at 11:00am without being seen.  Patient was next to go back.  Office states patient was referred over by Dr. Alwyn RenHopper.  Office wanted to call to apologize for not getting patient in.

## 2014-08-21 NOTE — Telephone Encounter (Signed)
I have little to offer further, but I can refer to [facility], let me know if this is ok

## 2014-08-21 NOTE — Telephone Encounter (Signed)
I don't see that I have ever seen her; Dr Raphael GibneyJohn's patient

## 2014-08-21 NOTE — Telephone Encounter (Signed)
Patient states that the meds she is on for her migraines are not working.  She has had a headache all week.  She would like to know what she might need to do.

## 2014-08-22 NOTE — Telephone Encounter (Signed)
Called the patient left a detailed message of PCP instructions and to call back if want a referral

## 2017-07-12 DIAGNOSIS — G5620 Lesion of ulnar nerve, unspecified upper limb: Secondary | ICD-10-CM | POA: Insufficient documentation

## 2017-07-12 DIAGNOSIS — G5603 Carpal tunnel syndrome, bilateral upper limbs: Secondary | ICD-10-CM | POA: Insufficient documentation

## 2020-02-13 DIAGNOSIS — R7303 Prediabetes: Secondary | ICD-10-CM | POA: Insufficient documentation

## 2020-02-13 DIAGNOSIS — E119 Type 2 diabetes mellitus without complications: Secondary | ICD-10-CM | POA: Insufficient documentation

## 2021-03-18 ENCOUNTER — Telehealth: Payer: Self-pay | Admitting: Internal Medicine

## 2021-03-18 NOTE — Telephone Encounter (Signed)
Ok with me but please to let her know that I dont prescribe narcotic for chronic pain, if this might be what she had in mind, thanks

## 2021-03-18 NOTE — Telephone Encounter (Signed)
LV for patient to call the office back

## 2021-03-18 NOTE — Telephone Encounter (Signed)
Patient called in wanting to see Dr. Beulah Gandy patient she has not seen provider since 2015 so patient care would need to be reestablished  Wants to know if Dr. Jonny Ruiz is willing to reestablish care so she can be seen ?   Req callback [phone]

## 2021-04-05 ENCOUNTER — Ambulatory Visit: Payer: BC Managed Care – PPO | Admitting: Internal Medicine

## 2021-04-08 ENCOUNTER — Ambulatory Visit: Payer: Self-pay | Admitting: Internal Medicine

## 2021-04-22 ENCOUNTER — Encounter: Payer: Self-pay | Admitting: Internal Medicine

## 2021-04-22 ENCOUNTER — Other Ambulatory Visit: Payer: Self-pay

## 2021-04-22 ENCOUNTER — Ambulatory Visit: Payer: BC Managed Care – PPO | Admitting: Internal Medicine

## 2021-04-22 VITALS — BP 138/88 | HR 106 | Temp 98.6°F | Wt 249.2 lb

## 2021-04-22 DIAGNOSIS — Z1159 Encounter for screening for other viral diseases: Secondary | ICD-10-CM

## 2021-04-22 DIAGNOSIS — Z23 Encounter for immunization: Secondary | ICD-10-CM | POA: Diagnosis not present

## 2021-04-22 DIAGNOSIS — L732 Hidradenitis suppurativa: Secondary | ICD-10-CM | POA: Insufficient documentation

## 2021-04-22 DIAGNOSIS — Z0001 Encounter for general adult medical examination with abnormal findings: Secondary | ICD-10-CM

## 2021-04-22 DIAGNOSIS — R600 Localized edema: Secondary | ICD-10-CM | POA: Insufficient documentation

## 2021-04-22 DIAGNOSIS — E7800 Pure hypercholesterolemia, unspecified: Secondary | ICD-10-CM | POA: Diagnosis not present

## 2021-04-22 DIAGNOSIS — E559 Vitamin D deficiency, unspecified: Secondary | ICD-10-CM

## 2021-04-22 DIAGNOSIS — R0600 Dyspnea, unspecified: Secondary | ICD-10-CM | POA: Insufficient documentation

## 2021-04-22 DIAGNOSIS — E538 Deficiency of other specified B group vitamins: Secondary | ICD-10-CM | POA: Diagnosis not present

## 2021-04-22 DIAGNOSIS — R609 Edema, unspecified: Secondary | ICD-10-CM | POA: Insufficient documentation

## 2021-04-22 DIAGNOSIS — M545 Low back pain, unspecified: Secondary | ICD-10-CM

## 2021-04-22 DIAGNOSIS — G8929 Other chronic pain: Secondary | ICD-10-CM

## 2021-04-22 DIAGNOSIS — K760 Fatty (change of) liver, not elsewhere classified: Secondary | ICD-10-CM | POA: Insufficient documentation

## 2021-04-22 DIAGNOSIS — Z114 Encounter for screening for human immunodeficiency virus [HIV]: Secondary | ICD-10-CM

## 2021-04-22 DIAGNOSIS — R7303 Prediabetes: Secondary | ICD-10-CM

## 2021-04-22 MED ORDER — MELOXICAM 15 MG PO TABS: ORAL_TABLET | ORAL | 3 refills | Status: DC

## 2021-04-22 MED ORDER — CYCLOBENZAPRINE HCL 5 MG PO TABS: 5.0000 mg | ORAL_TABLET | Freq: Three times a day (TID) | ORAL | 1 refills | Status: DC | PRN

## 2021-04-22 MED ORDER — ALBUTEROL SULFATE HFA 108 (90 BASE) MCG/ACT IN AERS: 2.0000 | INHALATION_SPRAY | Freq: Four times a day (QID) | RESPIRATORY_TRACT | 0 refills | Status: DC | PRN

## 2021-04-22 NOTE — Patient Instructions (Addendum)
Please take all new medication as prescribed - the inhaler as needed, as well as the anti-inflammatory, and muscle relaxer as needed  Please continue all other medications as before, and refills have been done if requested.  Please have the pharmacy call with any other refills you may need.  Please continue your efforts at being more active, low cholesterol diet, and weight control.  You are otherwise up to date with prevention measures today.  Please keep your appointments with your specialists as you may have planned  Please go to the XRAY Department in the basement [address] at your convenience  Please go to the LAB at the blood drawing area for the tests to be done at the [facility] at your convenience  You will be contacted by phone if any changes need to be made immediately.  Otherwise, you will receive a letter about your results with an explanation, but please check with MyChart first.  Please remember to sign up for MyChart if you have not done so, as this will be important to you in the future with finding out test results, communicating by private email, and scheduling acute appointments online when needed.  Please make an Appointment to return in 6 months, or sooner if needed

## 2021-04-22 NOTE — Progress Notes (Signed)
Patient ID: Kathleen Hendricks, female   DOB: August 02, 1981, 40 y.o.   MRN: [id number]         Chief Complaint:: wellness exam as new pt, and bilateral leg swelling, hyperglycemia, low back pain       HPI:  Kathleen Hendricks is a 40 y.o. female here for wellness exam; due for hep c and HIV screen, declines covid vax and tdap, plans to see GYN for pap, ow up to date with preventive referrals and immunizations                        Also Pt continues to have recurring LBP without change in severity, bowel or bladder change, fever, wt loss,  worsening LE pain/numbness/weakness, gait change or falls  Has ongoing obesity with recent change wegovy to saxenda.  Pt denies chest pain, increased sob or doe, wheezing, orthopnea, PND, palpitations, dizziness or syncope, but has had worsening left right leg swelling for 1 wk, better in the AM, worse in the PM.  Also has breathless to speaking today somewhat but pt explains this as anxiety and stress today, has had this happen in the past.  Also mentions hx of Ileus episode feb 2022 for unclear reason with n/v/d seen inED and d/c home with probiotics, now resolved without recurrence.    Wt Readings from Last 3 Encounters:  04/22/21 249 lb 3.2 oz (113 kg)  06/19/14 225 lb (102.1 kg)  05/29/14 228 lb (103.4 kg)   BP Readings from Last 3 Encounters:  04/22/21 138/88  06/19/14 98/62  05/29/14 92/66   Immunization History  Administered Date(s) Administered   Influenza Inj Mdck Quad With Preservative 05/09/2019   Influenza,inj,Quad PF,6+ Mos 04/02/2014, 04/22/2021   Influenza-Unspecified 03/08/2014, 05/11/2015   Health Maintenance Due  Topic Date Due   Hepatitis C Screening  Never done      Past Medical History:  Diagnosis Date   ANXIETY 03/24/2007   Qualifier: Diagnosis of  By: Jonny Ruiz MD, Len Blalock    Common migraine 03/21/2007   Qualifier: Diagnosis of  By: Maris Berger    DEPRESSION 03/24/2007   Qualifier: Diagnosis of  By: Jonny Ruiz MD, Len Blalock     Headache(784.0)    Obesity (BMI 35.0-39.9 without comorbidity)    Obesity, unspecified 04/02/2014   Other and unspecified hyperlipidemia 04/02/2014   Past Surgical History:  Procedure Laterality Date   GANGLION CYST EXCISION     left wrist   PILONIDAL CYST EXCISION      reports that she has never smoked. She has never used smokeless tobacco. She reports that she does not drink alcohol and does not use drugs. family history includes Arthritis in her mother; Cancer in her paternal grandmother; Diabetes in her mother; Heart disease in her father and mother; Hyperlipidemia in her father and mother; Hypertension in her father and mother. No Known Allergies Current Outpatient Medications on File Prior to Visit  Medication Sig Dispense Refill   SUMAtriptan (IMITREX) 100 MG tablet Take 1 tablet (100 mg total) by mouth every 2 (two) hours as needed for migraine or headache. May repeat in 2 hours if headache persists or recurs. 10 tablet 5   topiramate (TOPAMAX) 50 MG tablet Take 1 tablet (50 mg total) by mouth 2 (two) times daily. 60 tablet 11   UBRELVY 100 MG TABS Take by mouth. TAKE 1 BY MOUTH DAILY     SAXENDA 18 MG/3ML SOPN Inject 3 mg into the skin daily.  TAKE 1 EVERY DAY     No current facility-administered medications on file prior to visit.        ROS:  All others reviewed and negative.  Objective        PE:  BP 138/88 (BP Location: Left Arm)   Pulse (!) 106   Temp 98.6 F (37 C) (Oral)   Wt 249 lb 3.2 oz (113 kg)   SpO2 97%   BMI 39.03 kg/m                 Constitutional: Pt appears in NAD               HENT: Head: NCAT.                Right Ear: External ear normal.                 Left Ear: External ear normal.                Eyes: . Pupils are equal, round, and reactive to light. Conjunctivae and EOM are normal               Nose: without d/c or deformity               Neck: Neck supple. Gross normal ROM               Cardiovascular: Normal rate and regular rhythm.                  Pulmonary/Chest: Effort normal and breath sounds without rales or wheezing.                Abd:  Soft, NT, ND, + BS, no organomegaly               Neurological: Pt is alert. At baseline orientation, motor grossly intact               Skin: Skin is warm. No rashes, no other new lesions, LE edema - trace edema left > right bilateral LEs               Psychiatric: Pt behavior is normal without agitation   Micro: none  Cardiac tracings I have personally interpreted today:  none  Pertinent Radiological findings (summarize): none   Lab Results  Component Value Date   WBC 4.9 04/02/2014   HGB 13.0 04/02/2014   HCT 38.7 04/02/2014   PLT 356.0 04/02/2014   GLUCOSE 88 04/02/2014   CHOL 185 04/02/2014   TRIG 54.0 04/02/2014   HDL 45.00 04/02/2014   LDLCALC 129 (H) 04/02/2014   ALT 33 04/02/2014   AST 27 04/02/2014   NA 138 04/02/2014   K 3.8 04/02/2014   CL 105 04/02/2014   CREATININE 0.7 04/02/2014   BUN 8 04/02/2014   CO2 24 04/02/2014   TSH 1.90 04/02/2014   Assessment/Plan:  Kathleen Hendricks is a 40 y.o. Black or African American [2] female with  has a past medical history of ANXIETY (03/24/2007), Common migraine (03/21/2007), DEPRESSION (03/24/2007), Headache(784.0), Obesity (BMI 35.0-39.9 without comorbidity), Obesity, unspecified (04/02/2014), and Other and unspecified hyperlipidemia (04/02/2014).  Encounter for well adult exam with abnormal findings Age and sex appropriate education and counseling updated with regular exercise and diet Referrals for preventative services - for hep c and HIV screen, plans to see GYN on her own for pap Immunizations addressed - declines covid vax, tdap Smoking counseling  - none needed Evidence for  depression or other mood disorder - none significant Most recent labs reviewed. I have personally reviewed and have noted: 1) the patient's medical and social history 2) The patient's current medications and supplements 3) The patient's height,  weight, and BMI have been recorded in the chart   Pre-diabetes No results found for: HGBA1C Stable, pt to continue current medical treatment  - diet, and continue saxenda   Peripheral edema ? Venous insufficiency, for compression stockings, leg elevation, low salt, wt loss, and check BNP  LOW BACK PAIN C/w likely underlying djd/ddd - for nsaid and flexeril prn,  to f/u any worsening symptoms or concerns  Dyspnea Pt states acute situational emotional in nature, exam benign, will continue to follow, check labs as ordered including cbc bnp, also for albuterol hfa prn, and cxr  Followup: Return in about 6 months (around 10/20/2021).  Oliver Barre, MD 04/26/2021 3:58 PM [facility]

## 2021-04-26 ENCOUNTER — Encounter: Payer: Self-pay | Admitting: Internal Medicine

## 2021-04-26 NOTE — Assessment & Plan Note (Signed)
C/w likely underlying djd/ddd - for nsaid and flexeril prn,  to f/u any worsening symptoms or concerns

## 2021-04-26 NOTE — Assessment & Plan Note (Signed)
Age and sex appropriate education and counseling updated with regular exercise and diet Referrals for preventative services - for hep c and HIV screen, plans to see GYN on her own for pap Immunizations addressed - declines covid vax, tdap Smoking counseling  - none needed Evidence for depression or other mood disorder - none significant Most recent labs reviewed. I have personally reviewed and have noted: 1) the patient's medical and social history 2) The patient's current medications and supplements 3) The patient's height, weight, and BMI have been recorded in the chart

## 2021-04-26 NOTE — Assessment & Plan Note (Signed)
?   Venous insufficiency, for compression stockings, leg elevation, low salt, wt loss, and check BNP

## 2021-04-26 NOTE — Assessment & Plan Note (Signed)
No results found for: HGBA1C Stable, pt to continue current medical treatment  - diet, and continue saxenda

## 2021-04-26 NOTE — Assessment & Plan Note (Addendum)
Pt states acute situational emotional in nature, exam benign, will continue to follow, check labs as ordered including cbc bnp, also for albuterol hfa prn, and cxr

## 2021-08-24 ENCOUNTER — Telehealth (INDEPENDENT_AMBULATORY_CARE_PROVIDER_SITE_OTHER): Payer: BC Managed Care – PPO | Admitting: Family Medicine

## 2021-08-24 VITALS — Temp 97.6°F

## 2021-08-24 DIAGNOSIS — U071 COVID-19: Secondary | ICD-10-CM

## 2021-08-24 MED ORDER — MOLNUPIRAVIR EUA 200MG CAPSULE: 4.0000 | ORAL_CAPSULE | Freq: Two times a day (BID) | ORAL | 0 refills | Status: AC

## 2021-08-24 MED ORDER — BENZONATATE 100 MG PO CAPS: ORAL_CAPSULE | ORAL | 0 refills | Status: DC

## 2021-08-24 NOTE — Patient Instructions (Signed)
---------------------------------------------------------------------------------------------------------------------------    WORK SLIP:  Patient Kathleen Hendricks,  Aug 15, 1980, was seen for a medical visit today, 08/24/21 . Please excuse from work for a COVID/flu like illness. If Covid19 testing is positive advise 10 days minimum from the onset of symptoms (08/22/21) PLUS 1 day of no fever and improved symptoms. Will defer to employer for a sooner return to work if patient has 2 negative covid tests 48 hours apart and is feeling better, or if symptoms have resolved, it is greater than 5 days since the positive test and the patient can wear a high-quality, tight fitting mask such as N95 or KN95 at all times for an additional 5 days. Would also suggest COVID19 antigen testing is negative prior to early return from a Covid illness.  Sincerely: E-signature: Dr. Colin Benton, DO [facility] Ph: [phone]   ------------------------------------------------------------------------------------------------------------------------------   HOME CARE TIPS:  -I sent the medication(s) we discussed to your pharmacy: Meds ordered this encounter  Medications   molnupiravir EUA (LAGEVRIO) 200 mg CAPS capsule    Sig: Take 4 capsules (800 mg total) by mouth 2 (two) times daily for 5 days.    Dispense:  40 capsule    Refill:  0   benzonatate (TESSALON PERLES) 100 MG capsule    Sig: 1-2 capsules up to twice daily as needed for cough    Dispense:  30 capsule    Refill:  0     -I sent in the [facility] treatment or referral you requested per our discussion. Please see the information provided below and discuss further with the pharmacist/treatment team.   -there is a chance of rebound illness after finishing your treatment. If you become sick again please isolate for an additional 5 days, plus 5 more days of masking.   -can use tylenol if needed for fevers, aches and pains per  instructions  -can use nasal saline a few times per day if you have nasal congestion  -stay hydrated, drink plenty of fluids and eat small healthy meals - avoid dairy  -can take 1000 IU (90mg) Vit D3 and 100-500 mg of Vit C daily per instructions  -stay home while sick, except to seek medical care. If you have COVID19, you will likely be contagious for 7-10 days. Flu or Influenza is likely contagious for about 7 days. Other respiratory viral infections remain contagious for 5-10+ days depending on the virus and many other factors. Wear a good mask that fits snugly (such as N95 or KN95) if around others to reduce the risk of transmission.  It was nice to meet you today, and I really hope you are feeling better soon. I help [facility] out with telemedicine visits on Tuesdays and Thursdays and am happy to help if you need a follow up virtual visit on those days. Otherwise, if you have any concerns or questions following this visit please schedule a follow up visit with your Primary Care doctor or seek care at a local urgent care clinic to avoid delays in care.    Seek in person care or schedule a follow up video visit promptly if your symptoms worsen, new concerns arise or you are not improving with treatment. Call 911 and/or seek emergency care if your symptoms are severe or life threatening.    Fact Sheet for Patients And Caregivers Emergency Use Authorization (EUA) Of LAGEVRIO (molnupiravir) capsules For Coronavirus Disease 2019 (COVID-19)  What is the most important information I should know about LAGEVRIO?  LAGEVRIO may cause serious side effects, including: ? LAGEVRIO may cause harm to your unborn baby. It is not known if LAGEVRIO will harm your baby if you take LAGEVRIO during pregnancy. o LAGEVRIO is not recommended for use in pregnancy. o LAGEVRIO has not been studied in pregnancy. LAGEVRIO was studied in pregnant animals only. When LAGEVRIO was given to pregnant animals, LAGEVRIO  caused harm to their unborn babies. o You and your healthcare provider may decide that you should take LAGEVRIO during pregnancy if there are no other COVID-19 treatment options approved or authorized by the FDA that are accessible or clinically appropriate for you. o If you and your healthcare provider decide that you should take LAGEVRIO during pregnancy, you and your healthcare provider should discuss the known and potential benefits and the potential risks of taking LAGEVRIO during pregnancy. For individuals who are able to become pregnant: ? You should use a reliable method of birth control (contraception) consistently and correctly during treatment with LAGEVRIO and for 4 days after the last dose of LAGEVRIO. Talk to your healthcare provider about reliable birth control methods. ? Before starting treatment with [facility] your healthcare provider may do a pregnancy test to see if you are pregnant before starting treatment with LAGEVRIO. ? Tell your healthcare provider right away if you become pregnant or think you may be pregnant during treatment with LAGEVRIO. Pregnancy Surveillance Program: ? There is a pregnancy surveillance program for individuals who take LAGEVRIO during pregnancy. The purpose of this program is to collect information about the health of you and your baby. Talk to your healthcare provider about how to take part in this program. ? If you take LAGEVRIO during pregnancy and you agree to participate in the pregnancy surveillance program and allow your healthcare provider to share your information with [place], then your healthcare provider will report your use of [place] during pregnancy to [facility]. by calling [phone] or PeacefulBlog.es. For individuals who are sexually active with partners who are able to become pregnant: ? It is not known if LAGEVRIO can affect sperm. While the risk is regarded as low, animal studies  to fully assess the potential for LAGEVRIO to affect the babies of males treated with LAGEVRIO have not been completed. A reliable method of birth control (contraception) should be used consistently and correctly during treatment with LAGEVRIO and for at least 3 months after the last dose. The risk to sperm beyond 3 months is not known. Studies to understand the risk to sperm beyond 3 months are ongoing. Talk to your healthcare provider about reliable birth control methods. Talk to your healthcare provider if you have questions or concerns about how LAGEVRIO may affect sperm. You are being given this fact sheet because your healthcare provider believes it is necessary to provide you with LAGEVRIO for the treatment of adults with mild-to-moderate coronavirus disease 2019 (COVID-19) with positive results of direct SARS-CoV-2 viral testing, and who are at high risk for progression to severe COVID-19 including hospitalization or death, and for whom other COVID-19 treatment options approved or authorized by the FDA are not accessible or clinically appropriate. The U.S. Food and Drug Administration (FDA) has issued an Emergency Use Authorization (EUA) to make LAGEVRIO available during the COVID-19 pandemic (for more details about an EUA please see What is an Emergency Use Authorization? at the end of this document). LAGEVRIO is not an FDA-approved medicine in the [place]. Read this Fact Sheet for information about LAGEVRIO.  Talk to your healthcare provider about your options if you have any questions. It is your choice to take LAGEVRIO.  What is COVID-19? COVID-19 is caused by a virus called a coronavirus. You can get COVID-19 through close contact with another person who has the virus. COVID-19 illnesses have ranged from very mild-to-severe, including illness resulting in death. While information so far suggests that most COVID-19 illness is mild, serious illness can happen and may cause  some of your other medical conditions to become worse. Older people and people of all ages with severe, long lasting (chronic) medical conditions like heart disease, lung disease and diabetes, for example seem to be at higher risk of being hospitalized for COVID-19.  What is LAGEVRIO? LAGEVRIO is an investigational medicine used to treat mild-to-moderate COVID-19 in adults: ? with positive results of direct SARS-CoV-2 viral testing, and ? who are at high risk for progression to severe COVID-19 including hospitalization or death, and for whom other COVID-19 treatment options approved or authorized by the FDA are not accessible or clinically appropriate. The FDA has authorized the emergency use of LAGEVRIO for the treatment of mild-tomoderate COVID-19 in adults under an EUA. For more information on EUA, see the What is an Emergency Use Authorization (EUA)? section at the end of this Fact Sheet. LAGEVRIO is not authorized: ? for use in people less than 17 years of age. ? for prevention of COVID-19. ? for people needing hospitalization for COVID-19. ? for use for longer than 5 consecutive days.  What should I tell my healthcare provider before I take LAGEVRIO? Tell your healthcare provider if you: ? Have any allergies ? Are breastfeeding or plan to breastfeed ? Have any serious illnesses ? Are taking any medicines (prescription, over-the-counter, vitamins, or herbal products).  How do I take LAGEVRIO? ? Take LAGEVRIO exactly as your healthcare provider tells you to take it. ? Take 4 capsules of LAGEVRIO every 12 hours (for example, at 8 am and at 8 pm) ? Take LAGEVRIO for 5 days. It is important that you complete the full 5 days of treatment with LAGEVRIO. Do not stop taking LAGEVRIO before you complete the full 5 days of treatment, even if you feel better. ? Take LAGEVRIO with or without food. ? You should stay in isolation for as long as your healthcare provider tells you to. Talk  to your healthcare provider if you are not sure about how to properly isolate while you have COVID-19. ? Swallow LAGEVRIO capsules whole. Do not open, break, or crush the capsules. If you cannot swallow capsules whole, tell your healthcare provider. ? What to do if you miss a dose: o If it has been less than 10 hours since the missed dose, take it as soon as you remember o If it has been more than 10 hours since the missed dose, skip the missed dose and take your dose at the next scheduled time. ? Do not double the dose of LAGEVRIO to make up for a missed dose.  What are the important possible side effects of LAGEVRIO? ? See, What is the most important information I should know about LAGEVRIO? ? Allergic Reactions. Allergic reactions can happen in people taking LAGEVRIO, even after only 1 dose. Stop taking LAGEVRIO and call your healthcare provider right away if you get any of the following symptoms of an allergic reaction: o hives o rapid heartbeat o trouble swallowing or breathing o swelling of the mouth, lips, or face o throat tightness o hoarseness  o skin rash The most common side effects of LAGEVRIO are: ? diarrhea ? nausea ? dizziness These are not all the possible side effects of LAGEVRIO. Not many people have taken LAGEVRIO. Serious and unexpected side effects may happen. This medicine is still being studied, so it is possible that all of the risks are not known at this time.  What other treatment choices are there?  Veklury (remdesivir) is FDA-approved as an intravenous (IV) infusion for the treatment of mildto-moderate WUJWJ-19 in certain adults and children. Talk with your doctor to see if Marijean Heath is appropriate for you. Like LAGEVRIO, FDA may also allow for the emergency use of other medicines to treat people with COVID-19. Go to LacrosseProperties.si for more  information. It is your choice to be treated or not to be treated with LAGEVRIO. Should you decide not to take it, it will not change your standard medical care.  What if I am breastfeeding? Breastfeeding is not recommended during treatment with LAGEVRIO and for 4 days after the last dose of LAGEVRIO. If you are breastfeeding or plan to breastfeed, talk to your healthcare provider about your options and specific situation before taking LAGEVRIO.  How do I report side effects with LAGEVRIO? Contact your healthcare provider if you have any side effects that bother you or do not go away. Report side effects to FDA MedWatch at SmoothHits.hu or call 1-800-FDA-1088 (1- 417-437-7753).  How should I store [place]? ? Store LAGEVRIO capsules at room temperature between 21F to 81F (20C to 25C). ? Keep LAGEVRIO and all medicines out of the reach of children and pets. How can I learn more about COVID-19? ? Ask your healthcare provider. ? Visit SeekRooms.co.uk ? Contact your local or state public health department. ? Call [place] at [phone] (toll free in the [place] ? Visit www.molnupiravir.com  What Is an Emergency Use Authorization (EUA)? The [place] FDA has made [place] available under an emergency access mechanism called an Emergency Use Authorization (EUA) The EUA is supported by a Presenter, broadcasting Health and Human Service (HHS) declaration that circumstances exist to justify emergency use of drugs and biological products during the COVID-19 pandemic. LAGEVRIO for the treatment of mild-to-moderate COVID-19 in adults with positive results of direct SARS-CoV-2 viral testing, who are at high risk for progression to severe COVID-19, including hospitalization or death, and for whom alternative COVID-19 treatment options approved or authorized by FDA are not accessible or clinically appropriate, has not undergone the same type of review as an FDA-approved  product. In issuing an EUA under the YQMVH-84 public health emergency, the FDA has determined, among other things, that based on the total amount of scientific evidence available including data from adequate and well-controlled clinical trials, if available, it is reasonable to believe that the product may be effective for diagnosing, treating, or preventing COVID-19, or a serious or life-threatening disease or condition caused by COVID-19; that the known and potential benefits of the product, when used to diagnose, treat, or prevent such disease or condition, outweigh the known and potential risks of such product; and that there are no adequate, approved, and available alternatives.  All of these criteria must be met to allow for the product to be used in the treatment of patients during the COVID-19 pandemic. The EUA for LAGEVRIO is in effect for the duration of the COVID-19 declaration justifying emergency use of LAGEVRIO, unless terminated or revoked (after which LAGEVRIO may no longer be used under the EUA). For patent information:  http://rogers.info/ Copyright  2021-2022 [place] [place], [place] and its affiliates. All rights reserved. [id number] Revised: March 2022

## 2021-08-24 NOTE — Progress Notes (Signed)
Virtual Visit via Video Note  I connected with Kathleen Hendricks  on 08/24/21 at  3:00 PM EST by a video enabled telemedicine application and verified that I am speaking with the correct person using two identifiers.  Location patient: [place] Location provider:work or home office Persons participating in the virtual visit: patient, provider  I discussed the limitations and requested verbal permission for telemedicine visit. The patient expressed understanding and agreed to proceed.   HPI:  Acute telemedicine visit for Covid19: -Onset: 1-2 days ago -Symptoms include: cough, sore throat, body aches, chest feels a little tight at times -Denies:fever, CP, NVD -tolerating oral fluids -Has tried:cough drops and OTC cough medications, has been given albuterol in the past by PCP for occ SOB -Pertinent past medical history: see below; has never had covid before to her knowledge -no recent renal check -denies any chance of pregnancy and reports no concerns for pregnancy in the next month -Pertinent medication allergies:No Known Allergies -COVID-19 vaccine status: 2 doses and a booster Immunization History  Administered Date(s) Administered   Influenza Inj Mdck Quad With Preservative 05/09/2019   Influenza,inj,Quad PF,6+ Mos 04/02/2014, 04/22/2021   Influenza-Unspecified 03/08/2014, 05/11/2015     ROS: See pertinent positives and negatives per HPI.  Past Medical History:  Diagnosis Date   ANXIETY 03/24/2007   Qualifier: Diagnosis of  By: Jenny Reichmann MD, Hunt Oris    Common migraine 03/21/2007   Qualifier: Diagnosis of  By: Elveria Royals    DEPRESSION 03/24/2007   Qualifier: Diagnosis of  By: Jenny Reichmann MD, Hunt Oris    Headache(784.0)    Obesity (BMI 35.0-39.9 without comorbidity)    Obesity, unspecified 04/02/2014   Other and unspecified hyperlipidemia 04/02/2014    Past Surgical History:  Procedure Laterality Date   GANGLION CYST EXCISION     left wrist   PILONIDAL CYST EXCISION       Current  Outpatient Medications:    albuterol (VENTOLIN HFA) 108 (90 Base) MCG/ACT inhaler, Inhale 2 puffs into the lungs every 6 (six) hours as needed for wheezing or shortness of breath., Disp: 8 g, Rfl: 0   benzonatate (TESSALON PERLES) 100 MG capsule, 1-2 capsules up to twice daily as needed for cough, Disp: 30 capsule, Rfl: 0   cyclobenzaprine (FLEXERIL) 5 MG tablet, Take 1 tablet (5 mg total) by mouth 3 (three) times daily as needed for muscle spasms., Disp: 40 tablet, Rfl: 1   meloxicam (MOBIC) 15 MG tablet, 1 tab by mouth once daily as needed, Disp: 90 tablet, Rfl: 3   molnupiravir EUA (LAGEVRIO) 200 mg CAPS capsule, Take 4 capsules (800 mg total) by mouth 2 (two) times daily for 5 days., Disp: 40 capsule, Rfl: 0   SAXENDA 18 MG/3ML SOPN, Inject 3 mg into the skin daily. TAKE 1 EVERY DAY, Disp: , Rfl:    SUMAtriptan (IMITREX) 100 MG tablet, Take 1 tablet (100 mg total) by mouth every 2 (two) hours as needed for migraine or headache. May repeat in 2 hours if headache persists or recurs., Disp: 10 tablet, Rfl: 5   topiramate (TOPAMAX) 50 MG tablet, Take 1 tablet (50 mg total) by mouth 2 (two) times daily., Disp: 60 tablet, Rfl: 11   UBRELVY 100 MG TABS, Take by mouth. TAKE 1 BY MOUTH DAILY, Disp: , Rfl:   EXAM:  VITALS per patient if applicable:  GENERAL: alert, oriented, appears well and in no acute distress  HEENT: atraumatic, conjunttiva clear, no obvious abnormalities on inspection of external nose and ears  NECK:  normal movements of the head and neck  LUNGS: on inspection no signs of respiratory distress, breathing rate appears normal, no obvious gross SOB, gasping or wheezing  CV: no obvious cyanosis  MS: moves all visible extremities without noticeable abnormality  PSYCH/NEURO: pleasant and cooperative, no obvious depression or anxiety, speech and thought processing grossly intact  ASSESSMENT AND PLAN:  Discussed the following assessment and plan:  COVID-19   Discussed  treatment options and risk of drug interactions, ideal treatment window, potential complications, isolation and precautions for COVID-19.  Discussed possibility of rebound with antivirals and the need to reisolate if it should occur for 5 days. Checked for/reviewed any labs done in the last 90 days with GFR listed in HPI if available - none available. After lengthy discussion, the patient opted for treatment with Legevrio due to being higher risk for complications of covid or severe disease and other factors. Discussed EUA status of this drug and the fact that there is preliminary limited knowledge of risks/interactions/side effects per EUA document vs possible benefits and precautions. This information was shared with patient during the visit and also was provided in patient instructions.  The patient did want a prescription for cough, Tessalon Rx sent.  Other symptomatic care measures summarized in patient instructions. Work/School slipped offered: provided in patient instructions    Advised to seek prompt virtual visit or in person care if worsening, new symptoms arise, or if is not improving with treatment as expected per our conversation of expected course. Discussed options for follow up care.   I discussed the assessment and treatment plan with the patient. The patient was provided an opportunity to ask questions and all were answered. The patient agreed with the plan and demonstrated an understanding of the instructions.     Lucretia Kern, DO

## 2021-08-31 ENCOUNTER — Other Ambulatory Visit (INDEPENDENT_AMBULATORY_CARE_PROVIDER_SITE_OTHER): Payer: BC Managed Care – PPO

## 2021-08-31 DIAGNOSIS — E538 Deficiency of other specified B group vitamins: Secondary | ICD-10-CM | POA: Diagnosis not present

## 2021-08-31 DIAGNOSIS — Z1159 Encounter for screening for other viral diseases: Secondary | ICD-10-CM

## 2021-08-31 DIAGNOSIS — E7800 Pure hypercholesterolemia, unspecified: Secondary | ICD-10-CM | POA: Diagnosis not present

## 2021-08-31 DIAGNOSIS — R7303 Prediabetes: Secondary | ICD-10-CM | POA: Diagnosis not present

## 2021-08-31 DIAGNOSIS — R609 Edema, unspecified: Secondary | ICD-10-CM | POA: Diagnosis not present

## 2021-08-31 DIAGNOSIS — E559 Vitamin D deficiency, unspecified: Secondary | ICD-10-CM | POA: Diagnosis not present

## 2021-08-31 DIAGNOSIS — Z114 Encounter for screening for human immunodeficiency virus [HIV]: Secondary | ICD-10-CM

## 2021-08-31 LAB — CBC WITH DIFFERENTIAL/PLATELET
Basophils Absolute: 0 10*3/uL (ref 0.0–0.1)
Basophils Relative: 0.6 % (ref 0.0–3.0)
Eosinophils Absolute: 0.2 10*3/uL (ref 0.0–0.7)
Eosinophils Relative: 1.9 % (ref 0.0–5.0)
HCT: 39.4 % (ref 36.0–46.0)
Hemoglobin: 13 g/dL (ref 12.0–15.0)
Lymphocytes Relative: 29.3 % (ref 12.0–46.0)
Lymphs Abs: 2.3 10*3/uL (ref 0.7–4.0)
MCHC: 33 g/dL (ref 30.0–36.0)
MCV: 84.4 fl (ref 78.0–100.0)
Monocytes Absolute: 0.6 10*3/uL (ref 0.1–1.0)
Monocytes Relative: 7.4 % (ref 3.0–12.0)
Neutro Abs: 4.8 10*3/uL (ref 1.4–7.7)
Neutrophils Relative %: 60.8 % (ref 43.0–77.0)
Platelets: 409 10*3/uL — ABNORMAL HIGH (ref 150.0–400.0)
RBC: 4.67 Mil/uL (ref 3.87–5.11)
RDW: 13.1 % (ref 11.5–15.5)
WBC: 7.9 10*3/uL (ref 4.0–10.5)

## 2021-08-31 LAB — HEPATIC FUNCTION PANEL
ALT: 36 U/L — ABNORMAL HIGH (ref 0–35)
AST: 33 U/L (ref 0–37)
Albumin: 4.1 g/dL (ref 3.5–5.2)
Alkaline Phosphatase: 69 U/L (ref 39–117)
Bilirubin, Direct: 0 mg/dL (ref 0.0–0.3)
Total Bilirubin: 0.3 mg/dL (ref 0.2–1.2)
Total Protein: 7.9 g/dL (ref 6.0–8.3)

## 2021-08-31 LAB — BASIC METABOLIC PANEL
BUN: 6 mg/dL (ref 6–23)
CO2: 25 mEq/L (ref 19–32)
Calcium: 9.8 mg/dL (ref 8.4–10.5)
Chloride: 103 mEq/L (ref 96–112)
Creatinine, Ser: 0.62 mg/dL (ref 0.40–1.20)
GFR: 109.47 mL/min (ref >60.00)
Glucose, Bld: 148 mg/dL — ABNORMAL HIGH (ref 70–99)
Potassium: 3.6 mEq/L (ref 3.5–5.1)
Sodium: 137 mEq/L (ref 135–145)

## 2021-08-31 LAB — URINALYSIS, ROUTINE W REFLEX MICROSCOPIC
Bilirubin Urine: NEGATIVE
Hgb urine dipstick: NEGATIVE
Ketones, ur: NEGATIVE
Leukocytes,Ua: NEGATIVE
Nitrite: NEGATIVE
RBC / HPF: NONE SEEN (ref 0–?)
Specific Gravity, Urine: 1.015 (ref 1.000–1.030)
Total Protein, Urine: NEGATIVE
Urine Glucose: NEGATIVE
Urobilinogen, UA: 0.2 (ref 0.0–1.0)
pH: 7 (ref 5.0–8.0)

## 2021-08-31 LAB — LIPID PANEL
Cholesterol: 191 mg/dL (ref 0–200)
HDL: 37.3 mg/dL — ABNORMAL LOW (ref >39.00)
LDL Cholesterol: 122 mg/dL — ABNORMAL HIGH (ref 0–99)
NonHDL: 153.26
Total CHOL/HDL Ratio: 5
Triglycerides: 155 mg/dL — ABNORMAL HIGH (ref 0.0–149.0)
VLDL: 31 mg/dL (ref 0.0–40.0)

## 2021-08-31 LAB — BRAIN NATRIURETIC PEPTIDE: Pro B Natriuretic peptide (BNP): 8 pg/mL (ref 0.0–100.0)

## 2021-08-31 LAB — VITAMIN D 25 HYDROXY (VIT D DEFICIENCY, FRACTURES): VITD: 17.29 ng/mL — ABNORMAL LOW (ref 30.00–100.00)

## 2021-08-31 LAB — TSH: TSH: 2.78 u[IU]/mL (ref 0.35–5.50)

## 2021-08-31 LAB — VITAMIN B12: Vitamin B-12: 240 pg/mL (ref 211–911)

## 2021-08-31 LAB — HEMOGLOBIN A1C: Hgb A1c MFr Bld: 6.5 % (ref 4.6–6.5)

## 2021-08-31 NOTE — Addendum Note (Signed)
Addended by: Sumner Boast on: 08/31/2021 02:45 PM   Modules accepted: Orders

## 2021-09-01 LAB — HIV ANTIBODY (ROUTINE TESTING W REFLEX): HIV 1&2 Ab, 4th Generation: NONREACTIVE

## 2021-09-01 LAB — HEPATITIS C ANTIBODY
Hepatitis C Ab: NONREACTIVE
SIGNAL TO CUT-OFF: 0.07 (ref <1.00)

## 2021-10-18 DIAGNOSIS — O09519 Supervision of elderly primigravida, unspecified trimester: Secondary | ICD-10-CM | POA: Insufficient documentation

## 2021-10-18 LAB — OB RESULTS CONSOLE GC/CHLAMYDIA
Chlamydia: NEGATIVE
Neisseria Gonorrhea: NEGATIVE

## 2021-10-20 ENCOUNTER — Ambulatory Visit: Payer: BC Managed Care – PPO | Admitting: Internal Medicine

## 2021-11-01 LAB — OB RESULTS CONSOLE RPR: RPR: NONREACTIVE

## 2021-11-01 LAB — OB RESULTS CONSOLE ABO/RH: RH Type: POSITIVE

## 2021-11-01 LAB — HEPATITIS C ANTIBODY: HCV Ab: NEGATIVE

## 2021-11-01 LAB — OB RESULTS CONSOLE ANTIBODY SCREEN: Antibody Screen: NEGATIVE

## 2021-11-01 LAB — OB RESULTS CONSOLE RUBELLA ANTIBODY, IGM: Rubella: IMMUNE

## 2021-11-01 LAB — OB RESULTS CONSOLE HIV ANTIBODY (ROUTINE TESTING): HIV: NONREACTIVE

## 2021-11-01 LAB — OB RESULTS CONSOLE HEPATITIS B SURFACE ANTIGEN: Hepatitis B Surface Ag: NEGATIVE

## 2022-02-16 DIAGNOSIS — B3731 Acute candidiasis of vulva and vagina: Secondary | ICD-10-CM | POA: Insufficient documentation

## 2022-02-16 DIAGNOSIS — N760 Acute vaginitis: Secondary | ICD-10-CM | POA: Insufficient documentation

## 2022-02-21 ENCOUNTER — Inpatient Hospital Stay (HOSPITAL_COMMUNITY)
Admission: AD | Admit: 2022-02-21 | Discharge: 2022-02-21 | Disposition: A | Discharge status: Home / Self Care | Payer: BC Managed Care – PPO | Attending: Obstetrics and Gynecology | Admitting: Obstetrics and Gynecology

## 2022-02-21 ENCOUNTER — Encounter (HOSPITAL_COMMUNITY): Payer: Self-pay | Admitting: *Deleted

## 2022-02-21 DIAGNOSIS — M542 Cervicalgia: Secondary | ICD-10-CM

## 2022-02-21 DIAGNOSIS — Z3A27 27 weeks gestation of pregnancy: Secondary | ICD-10-CM | POA: Diagnosis not present

## 2022-02-21 DIAGNOSIS — O26892 Other specified pregnancy related conditions, second trimester: Secondary | ICD-10-CM | POA: Diagnosis present

## 2022-02-21 DIAGNOSIS — O99891 Other specified diseases and conditions complicating pregnancy: Secondary | ICD-10-CM

## 2022-02-21 LAB — URINALYSIS, ROUTINE W REFLEX MICROSCOPIC
Bilirubin Urine: NEGATIVE
Glucose, UA: 50 mg/dL — AB
Hgb urine dipstick: NEGATIVE
Ketones, ur: 20 mg/dL — AB
Leukocytes,Ua: NEGATIVE
Nitrite: NEGATIVE
Protein, ur: 30 mg/dL — AB
Specific Gravity, Urine: 1.024 (ref 1.005–1.030)
pH: 5 (ref 5.0–8.0)

## 2022-02-21 MED ORDER — IBUPROFEN 600 MG PO TABS: 600.0000 mg | ORAL_TABLET | Freq: Four times a day (QID) | ORAL | 0 refills | Status: DC | PRN

## 2022-02-21 MED ORDER — CYCLOBENZAPRINE HCL 5 MG PO TABS
10.0000 mg | ORAL_TABLET | Freq: Three times a day (TID) | ORAL | Status: DC | PRN
  Administered 2022-02-21: 10 mg via ORAL
  Filled 2022-02-21: qty 2

## 2022-02-21 MED ORDER — CYCLOBENZAPRINE HCL 10 MG PO TABS: 10.0000 mg | ORAL_TABLET | Freq: Three times a day (TID) | ORAL | 0 refills | Status: DC | PRN

## 2022-02-21 MED ORDER — IBUPROFEN 600 MG PO TABS
600.0000 mg | ORAL_TABLET | Freq: Once | ORAL | Status: AC
  Administered 2022-02-21: 600 mg via ORAL
  Filled 2022-02-21: qty 1

## 2022-02-21 NOTE — MAU Note (Signed)
Kathleen Hendricks is a 41 y.o. in MAU reporting: having pain on her rt side. Started with a HA, rt shoulder all the way down to her foot. Called her dr, was told to come in.  No bleeding or leaking.  Reports +FM ("he is busy") .  Took some Tylenol at 0300, no help Onset of complaint: started yesterday afternoon Pain score: HA: 8, shoulder 10, abd 7, back 10 Vitals:   02/21/22 1207  BP: 116/72  Pulse: (!) 115  Resp: 20  Temp: 98.4 F (36.9 C)  SpO2: 100%     VZC:HYIFO on, +FM Lab orders placed from triage:  urine

## 2022-02-21 NOTE — MAU Provider Note (Signed)
History     CSN: [id number]  Arrival date and time: 02/21/22 1153   Event Date/Time   First Provider Initiated Contact with Patient 02/21/22 1238      41 y.o. G2P0010 @27 .1 wks presenting with neck pain, back pain, and arm pain. Reports onset yesterday. Denies any lifting, pushing, pulling or injury. Endorses repetitive bending to pick up things at work recently. Describes pain is intermittent and shooting. Rates pain 8/10. Pain runs down behind right ear into the neck. Pain in her back is located right lumbar. Pain in her arm is ventral side of forearm near wrist. Reports hx of carpal tunnel and previous surgery. She has tried Tylenol for the pain but it didn't help. Denies pregnancy complaints. +FM.   OB History     Gravida  2   Para      Term      Preterm      AB  1   Living         SAB  1   IAB      Ectopic      Multiple      Live Births              Past Medical History:  Diagnosis Date   ANXIETY 03/24/2007   Qualifier: Diagnosis of  By: 03/26/2007 MD, Jonny Ruiz    Common migraine 03/21/2007   Qualifier: Diagnosis of  By: 03/23/2007    DEPRESSION 03/24/2007   Qualifier: Diagnosis of  By: 03/26/2007 MD, Jonny Ruiz    Headache(784.0)    Obesity (BMI 35.0-39.9 without comorbidity)    Obesity, unspecified 04/02/2014   Other and unspecified hyperlipidemia 04/02/2014    Past Surgical History:  Procedure Laterality Date   CARPAL TUNNEL RELEASE     GANGLION CYST EXCISION     left wrist   PILONIDAL CYST EXCISION      Family History  Problem Relation Age of Onset   Diabetes Mother    Hypertension Mother    Hyperlipidemia Mother    Heart disease Mother        CAD   Arthritis Mother        back surgery   Hypertension Father    Hyperlipidemia Father    Heart disease Father        CAD   Cancer Paternal Grandmother        breast    Social History   Tobacco Use   Smoking status: Never   Smokeless tobacco: Never  Substance Use Topics   Alcohol use:  No   Drug use: No    Allergies: No Known Allergies  Medications Prior to Admission  Medication Sig Dispense Refill Last Dose   aspirin EC 81 MG tablet Take 81 mg by mouth daily. Swallow whole.   02/21/2022   Prenatal Vit-Fe Fumarate-FA (PRENATAL MULTIVITAMIN) TABS tablet Take 1 tablet by mouth daily at 12 noon.   02/20/2022   albuterol (VENTOLIN HFA) 108 (90 Base) MCG/ACT inhaler Inhale 2 puffs into the lungs every 6 (six) hours as needed for wheezing or shortness of breath. 8 g 0    benzonatate (TESSALON PERLES) 100 MG capsule 1-2 capsules up to twice daily as needed for cough 30 capsule 0    cyclobenzaprine (FLEXERIL) 5 MG tablet Take 1 tablet (5 mg total) by mouth 3 (three) times daily as needed for muscle spasms. 40 tablet 1    meloxicam (MOBIC) 15 MG tablet 1 tab by mouth once daily as needed  90 tablet 3    SAXENDA 18 MG/3ML SOPN Inject 3 mg into the skin daily. TAKE 1 EVERY DAY      SUMAtriptan (IMITREX) 100 MG tablet Take 1 tablet (100 mg total) by mouth every 2 (two) hours as needed for migraine or headache. May repeat in 2 hours if headache persists or recurs. 10 tablet 5    topiramate (TOPAMAX) 50 MG tablet Take 1 tablet (50 mg total) by mouth 2 (two) times daily. 60 tablet 11    UBRELVY 100 MG TABS Take by mouth. TAKE 1 BY MOUTH DAILY       Review of Systems  Gastrointestinal:  Negative for abdominal pain.  Genitourinary:  Negative for vaginal bleeding.  Musculoskeletal:  Positive for back pain and neck pain.  Neurological:  Negative for weakness and numbness.   Physical Exam   Blood pressure 116/72, pulse (!) 115, temperature 98.4 F (36.9 C), temperature source Oral, resp. rate 20, height 5\' 6"  (1.676 m), weight 109 kg, SpO2 100 %.  Physical Exam Vitals and nursing note reviewed.  Constitutional:      General: She is not in acute distress.    Appearance: Normal appearance.  HENT:     Head: Normocephalic and atraumatic.  Neck:   Pulmonary:     Effort: Pulmonary  effort is normal. No respiratory distress.  Abdominal:     Palpations: Abdomen is soft.     Tenderness: There is no right CVA tenderness or left CVA tenderness.  Musculoskeletal:        General: Normal range of motion.     Right forearm: Normal.     Left forearm: Normal.     Right wrist: Normal.     Left wrist: Normal.     Cervical back: Normal and normal range of motion.     Thoracic back: Normal.     Lumbar back: Tenderness (Rt) present. No deformity.       Back:  Neurological:     General: No focal deficit present.     Mental Status: She is alert.     Motor: No weakness.  Psychiatric:        Mood and Affect: Mood normal.        Behavior: Behavior normal.   EFM: 120 bpm, mod variability, + accels, no decels Toco: none  Results for orders placed or performed during the hospital encounter of 02/21/22 (from the past 24 hour(s))  Urinalysis, Routine w reflex microscopic Urine, Clean Catch     Status: Abnormal   Collection Time: 02/21/22  1:31 PM  Result Value Ref Range   Color, Urine AMBER (A) YELLOW   APPearance CLOUDY (A) CLEAR   Specific Gravity, Urine 1.024 1.005 - 1.030   pH 5.0 5.0 - 8.0   Glucose, UA 50 (A) NEGATIVE mg/dL   Hgb urine dipstick NEGATIVE NEGATIVE   Bilirubin Urine NEGATIVE NEGATIVE   Ketones, ur 20 (A) NEGATIVE mg/dL   Protein, ur 30 (A) NEGATIVE mg/dL   Nitrite NEGATIVE NEGATIVE   Leukocytes,Ua NEGATIVE NEGATIVE   RBC / HPF 0-5 0 - 5 RBC/hpf   WBC, UA 6-10 0 - 5 WBC/hpf   Bacteria, UA FEW (A) NONE SEEN   Squamous Epithelial / LPF 21-50 0 - 5   Mucus PRESENT    MAU Course  Procedures Ibuprofen Flexeril  MDM No prenatal records on file for review. Labs ordered and reviewed. No signs of acute process, sx and exam consistent with MSK/nerve compression. Continues to have neck  pain after meds. Recommend heat and NSAIDS for a couple days (advised not to use after 28 weeks). Stable for discharge home.   Assessment and Plan   1. [redacted] weeks gestation  of pregnancy   2. Musculoskeletal neck pain   3. Back pain affecting pregnancy in second trimester    Discharge home Follow [facility] as scheduled Rx Ibuprofen Rx Flexeril OOW today and tomorrow-note provided  Allergies as of 02/21/2022   No Known Allergies      Medication List     STOP taking these medications    benzonatate 100 MG capsule Commonly known as: Tessalon Perles   meloxicam 15 MG tablet Commonly known as: Mobic   Saxenda 18 MG/3ML Sopn Generic drug: Liraglutide -Weight Management   SUMAtriptan 100 MG tablet Commonly known as: IMITREX   topiramate 50 MG tablet Commonly known as: Topamax   Ubrelvy 100 MG Tabs Generic drug: Ubrogepant       TAKE these medications    albuterol 108 (90 Base) MCG/ACT inhaler Commonly known as: VENTOLIN HFA Inhale 2 puffs into the lungs every 6 (six) hours as needed for wheezing or shortness of breath.   aspirin EC 81 MG tablet Take 81 mg by mouth daily. Swallow whole.   cyclobenzaprine 10 MG tablet Commonly known as: FLEXERIL Take 1 tablet (10 mg total) by mouth 3 (three) times daily as needed for muscle spasms. What changed:  medication strength how much to take   ibuprofen 600 MG tablet Commonly known as: ADVIL Take 1 tablet (600 mg total) by mouth every 6 (six) hours as needed. Do not use after 28 weeks   prenatal multivitamin Tabs tablet Take 1 tablet by mouth daily at 12 noon.        Donette Larry, CNM 02/21/2022, 12:59 PM

## 2022-03-08 DIAGNOSIS — U071 COVID-19: Secondary | ICD-10-CM | POA: Insufficient documentation

## 2022-03-09 ENCOUNTER — Encounter (HOSPITAL_COMMUNITY): Payer: Self-pay | Admitting: Obstetrics and Gynecology

## 2022-03-09 ENCOUNTER — Encounter: Payer: Self-pay | Admitting: Skilled Nursing Facility1

## 2022-03-09 ENCOUNTER — Inpatient Hospital Stay (HOSPITAL_COMMUNITY)
Admission: AD | Admit: 2022-03-09 | Discharge: 2022-03-09 | Disposition: A | Discharge status: Home / Self Care | Payer: BC Managed Care – PPO | Attending: Obstetrics and Gynecology | Admitting: Obstetrics and Gynecology

## 2022-03-09 ENCOUNTER — Encounter: Payer: BC Managed Care – PPO | Attending: Internal Medicine | Admitting: Skilled Nursing Facility1

## 2022-03-09 DIAGNOSIS — O24419 Gestational diabetes mellitus in pregnancy, unspecified control: Secondary | ICD-10-CM | POA: Insufficient documentation

## 2022-03-09 DIAGNOSIS — Z3A29 29 weeks gestation of pregnancy: Secondary | ICD-10-CM | POA: Diagnosis not present

## 2022-03-09 DIAGNOSIS — J988 Other specified respiratory disorders: Secondary | ICD-10-CM | POA: Insufficient documentation

## 2022-03-09 DIAGNOSIS — O99513 Diseases of the respiratory system complicating pregnancy, third trimester: Secondary | ICD-10-CM | POA: Diagnosis not present

## 2022-03-09 DIAGNOSIS — O98513 Other viral diseases complicating pregnancy, third trimester: Secondary | ICD-10-CM | POA: Insufficient documentation

## 2022-03-09 DIAGNOSIS — U071 COVID-19: Secondary | ICD-10-CM | POA: Diagnosis not present

## 2022-03-09 LAB — CBC WITH DIFFERENTIAL/PLATELET
Abs Immature Granulocytes: 0.03 10*3/uL (ref 0.00–0.07)
Basophils Absolute: 0 10*3/uL (ref 0.0–0.1)
Basophils Relative: 0 %
Eosinophils Absolute: 0.1 10*3/uL (ref 0.0–0.5)
Eosinophils Relative: 1 %
HCT: 35 % — ABNORMAL LOW (ref 36.0–46.0)
Hemoglobin: 11.5 g/dL — ABNORMAL LOW (ref 12.0–15.0)
Immature Granulocytes: 1 %
Lymphocytes Relative: 21 %
Lymphs Abs: 1.2 10*3/uL (ref 0.7–4.0)
MCH: 28 pg (ref 26.0–34.0)
MCHC: 32.9 g/dL (ref 30.0–36.0)
MCV: 85.2 fL (ref 80.0–100.0)
Monocytes Absolute: 0.5 10*3/uL (ref 0.1–1.0)
Monocytes Relative: 9 %
Neutro Abs: 3.8 10*3/uL (ref 1.7–7.7)
Neutrophils Relative %: 68 %
Platelets: 259 10*3/uL (ref 150–400)
RBC: 4.11 MIL/uL (ref 3.87–5.11)
RDW: 13.9 % (ref 11.5–15.5)
WBC: 5.6 10*3/uL (ref 4.0–10.5)
nRBC: 0 % (ref 0.0–0.2)

## 2022-03-09 LAB — COMPREHENSIVE METABOLIC PANEL
ALT: 16 U/L (ref 0–44)
AST: 22 U/L (ref 15–41)
Albumin: 2.8 g/dL — ABNORMAL LOW (ref 3.5–5.0)
Alkaline Phosphatase: 82 U/L (ref 38–126)
Anion gap: 7 (ref 5–15)
BUN: <5 mg/dL — ABNORMAL LOW (ref 6–20)
CO2: 22 mmol/L (ref 22–32)
Calcium: 9.2 mg/dL (ref 8.9–10.3)
Chloride: 107 mmol/L (ref 98–111)
Creatinine, Ser: 0.59 mg/dL (ref 0.44–1.00)
GFR, Estimated: >60 mL/min (ref >60)
Glucose, Bld: 139 mg/dL — ABNORMAL HIGH (ref 70–99)
Potassium: 3.5 mmol/L (ref 3.5–5.1)
Sodium: 136 mmol/L (ref 135–145)
Total Bilirubin: 0.2 mg/dL — ABNORMAL LOW (ref 0.3–1.2)
Total Protein: 6.4 g/dL — ABNORMAL LOW (ref 6.5–8.1)

## 2022-03-09 LAB — URINALYSIS, ROUTINE W REFLEX MICROSCOPIC
Bilirubin Urine: NEGATIVE
Glucose, UA: 150 mg/dL — AB
Hgb urine dipstick: NEGATIVE
Ketones, ur: NEGATIVE mg/dL
Leukocytes,Ua: NEGATIVE
Nitrite: NEGATIVE
Protein, ur: NEGATIVE mg/dL
Specific Gravity, Urine: 1.015 (ref 1.005–1.030)
pH: 6 (ref 5.0–8.0)

## 2022-03-09 NOTE — MAU Provider Note (Addendum)
History     CSN: [id number]  Arrival date and time: 03/09/22 1046   Event Date/Time   First Provider Initiated Contact with Patient 03/09/22 1145      Chief Complaint  Patient presents with   Decreased Fetal Movement   Ms. Kathleen Hendricks is a 41 y.o. year old G77P0010 female at [redacted]w[redacted]d weeks gestation who presents to MAU reporting (+) COVID-19 test at work last night. She reports that her sx's started with coughing and congestion yesterday. 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 She is unsure if she caught it from a resident or at a car dealership, [place] or somewhere else. She has had no sick contacts at home. She receives [facility] with [facility].   Picture of COVID-19 results from [facility] 03/08/2022 on patient's phone:   OB History     Gravida  2   Para      Term      Preterm      AB  1   Living         SAB  1   IAB      Ectopic      Multiple      Live Births              Past Medical History:  Diagnosis Date   ANXIETY 03/24/2007   Qualifier: Diagnosis of  By: Jonny Ruiz MD, Len Blalock    Common migraine 03/21/2007   Qualifier: Diagnosis of  By: Maris Berger    DEPRESSION 03/24/2007   Qualifier: Diagnosis of  By: Jonny Ruiz MD, Len Blalock    Headache(784.0)    Obesity (BMI 35.0-39.9 without comorbidity)    Obesity, unspecified 04/02/2014   Other and unspecified hyperlipidemia 04/02/2014    Past Surgical History:  Procedure Laterality Date   CARPAL TUNNEL RELEASE     GANGLION CYST EXCISION     left wrist   PILONIDAL CYST EXCISION      Family History  Problem Relation Age of Onset   Diabetes Mother    Hypertension Mother    Hyperlipidemia Mother    Heart disease Mother        CAD   Arthritis Mother        back surgery   Hypertension Father    Hyperlipidemia Father    Heart disease Father        CAD   Cancer Paternal Grandmother        breast    Social History   Tobacco Use   Smoking status: Never   Smokeless tobacco: Never   Substance Use Topics   Alcohol use: No   Drug use: No    Allergies: No Known Allergies  Medications Prior to Admission  Medication Sig Dispense Refill Last Dose   albuterol (VENTOLIN HFA) 108 (90 Base) MCG/ACT inhaler Inhale 2 puffs into the lungs every 6 (six) hours as needed for wheezing or shortness of breath. 8 g 0    aspirin EC 81 MG tablet Take 81 mg by mouth daily. Swallow whole.      cyclobenzaprine (FLEXERIL) 10 MG tablet Take 1 tablet (10 mg total) by mouth 3 (three) times daily as needed for muscle spasms. 30 tablet 0    ibuprofen (ADVIL) 600 MG tablet Take 1 tablet (600 mg total) by mouth every 6 (six) hours as needed. Do not use after 28 weeks 8 tablet 0    Prenatal Vit-Fe Fumarate-FA (PRENATAL MULTIVITAMIN) TABS tablet Take 1 tablet by mouth daily at 12  noon.       Review of Systems  Constitutional: Negative.   HENT:  Positive for congestion.   Eyes: Negative.   Respiratory:  Positive for cough. Negative for chest tightness and shortness of breath.   Cardiovascular:  Negative for chest pain.  Gastrointestinal: Negative.   Endocrine: Negative.   Genitourinary:        DFM today, "but he's been moving a lot since I've gotten here."  Musculoskeletal: Negative.   Skin: Negative.   Allergic/Immunologic: Negative.   Neurological: Negative.   Hematological: Negative.   Psychiatric/Behavioral: Negative.     Physical Exam   Blood pressure 131/76, pulse (!) 112, temperature 98 F (36.7 C), SpO2 99 %.  Physical Exam Vitals and nursing note reviewed.  Constitutional:      Appearance: Normal appearance. She is obese.  HENT:     Head: Normocephalic and atraumatic.  Cardiovascular:     Rate and Rhythm: Tachycardia present.     Heart sounds: Normal heart sounds.  Pulmonary:     Effort: Pulmonary effort is normal.     Breath sounds: Normal breath sounds.  Abdominal:     Palpations: Abdomen is soft.  Genitourinary:    Comments: Not indicated Musculoskeletal:         General: Normal range of motion.     Cervical back: Normal range of motion.  Skin:    General: Skin is warm and dry.  Neurological:     Mental Status: She is alert and oriented to person, place, and time.  Psychiatric:        Mood and Affect: Mood normal.        Behavior: Behavior normal.        Thought Content: Thought content normal.        Judgment: Judgment normal.    REACTIVE NST - FHR: 120 bpm / moderate variability / accels present / decels absent / TOCO: none  Reassessment @ 1330: Discussed Paxlovid and pregnancy. There are little studies done on pregnant women and limited fetal outcome results known for those who have take Paxlovid in pregnancy. Benefits may outweigh the risks. Patient declined the Rx. She was offered Rx by her OB last night when she called, but the OB told her she "didn't think she needed it since she only had cough and congestion."  MAU Course   MDM CCUA CBC w/Diff CMP  Results for orders placed or performed during the hospital encounter of 03/09/22 (from the past 24 hour(s))  Urinalysis, Routine w reflex microscopic Urine, Clean Catch     Status: Abnormal   Collection Time: 03/09/22 11:18 AM  Result Value Ref Range   Color, Urine YELLOW YELLOW   APPearance CLEAR CLEAR   Specific Gravity, Urine 1.015 1.005 - 1.030   pH 6.0 5.0 - 8.0   Glucose, UA 150 (A) NEGATIVE mg/dL   Hgb urine dipstick NEGATIVE NEGATIVE   Bilirubin Urine NEGATIVE NEGATIVE   Ketones, ur NEGATIVE NEGATIVE mg/dL   Protein, ur NEGATIVE NEGATIVE mg/dL   Nitrite NEGATIVE NEGATIVE   Leukocytes,Ua NEGATIVE NEGATIVE  CBC with Differential/Platelet     Status: Abnormal   Collection Time: 03/09/22 12:05 PM  Result Value Ref Range   WBC 5.6 4.0 - 10.5 K/uL   RBC 4.11 3.87 - 5.11 MIL/uL   Hemoglobin 11.5 (L) 12.0 - 15.0 g/dL   HCT 94.1 (L) 74.0 - 81.4 %   MCV 85.2 80.0 - 100.0 fL   MCH 28.0 26.0 - 34.0 pg  MCHC 32.9 30.0 - 36.0 g/dL   RDW 32.9 51.8 - 84.1 %   Platelets 259 150 -  400 K/uL   nRBC 0.0 0.0 - 0.2 %   Neutrophils Relative % 68 %   Neutro Abs 3.8 1.7 - 7.7 K/uL   Lymphocytes Relative 21 %   Lymphs Abs 1.2 0.7 - 4.0 K/uL   Monocytes Relative 9 %   Monocytes Absolute 0.5 0.1 - 1.0 K/uL   Eosinophils Relative 1 %   Eosinophils Absolute 0.1 0.0 - 0.5 K/uL   Basophils Relative 0 %   Basophils Absolute 0.0 0.0 - 0.1 K/uL   Immature Granulocytes 1 %   Abs Immature Granulocytes 0.03 0.00 - 0.07 K/uL    Assessment and Plan  COVID-19 affecting pregnancy in third trimester  - Offered Paxlovid -- declined - Advised to continue Mucinex as directed on the container - Information provided on safe meds in pregnancy list   [redacted] weeks gestation of pregnancy   - Discharge patient - Keep scheduled appt with [facility] - Patient verbalized an understanding of the plan of care and agrees.   Raelyn Mora, CNM 03/09/2022, 11:58 AM

## 2022-03-09 NOTE — MAU Note (Signed)
.  Kathleen Hendricks is a 41 y.o. at [redacted]w[redacted]d here in MAU reporting: had positive covid test on Sunday. Ha s been coughing and having upper respiratory symptoms. Stated she has not felt baby move much today that is why she came in.  LMP:  Onset of complaint: this morning Pain score: 0 Vitals:   03/09/22 1103  BP: 127/74  Pulse: (!) 110  Temp: 98 F (36.7 C)     FHT:130 Lab orders placed from triage:  U/A

## 2022-03-09 NOTE — Discharge Instructions (Signed)
Please continue taking Mucinex and Tylenol 1000 mg every 8 hours as needed for your symptoms. Here's a safe medication list to refer to, if you need to take other medications.  Safe Medications in Pregnancy   Acne: Benzoyl Peroxide Salicylic Acid  Backache/Headache: Tylenol: 2 regular strength every 4 hours OR              2 Extra strength every 6 hours  Colds/Coughs/Allergies: Benadryl (alcohol free) 25 mg every 6 hours as needed Breath right strips Claritin Cepacol throat lozenges Chloraseptic throat spray Cold-Eeze- up to three times per day Cough drops, alcohol free Flonase (by prescription only) Guaifenesin Mucinex Robitussin DM (plain only, alcohol free) Saline nasal spray/drops Sudafed (pseudoephedrine) & Actifed ** use only after [redacted] weeks gestation and if you do not have high blood pressure Tylenol Vicks Vaporub Zinc lozenges Zyrtec   Constipation: Colace Ducolax suppositories Fleet enema Glycerin suppositories Metamucil Milk of magnesia Miralax Senokot Smooth move tea  Diarrhea: Kaopectate Imodium A-D  *NO pepto Bismol  Hemorrhoids: Anusol Anusol HC Preparation H Tucks  Indigestion: Tums Maalox Mylanta Zantac  Pepcid  Insomnia: Benadryl (alcohol free) 25mg  every 6 hours as needed Tylenol PM Unisom, no Gelcaps  Leg Cramps: Tums MagGel  Nausea/Vomiting:  Bonine Dramamine Emetrol Ginger extract Sea bands Meclizine  Nausea medication to take during pregnancy:  Unisom (doxylamine succinate 25 mg tablets) Take one tablet daily at bedtime. If symptoms are not adequately controlled, the dose can be increased to a maximum recommended dose of two tablets daily (1/2 tablet in the morning, 1/2 tablet mid-afternoon and one at bedtime). Vitamin B6 100mg  tablets. Take one tablet twice a day (up to 200 mg per day).  Skin Rashes: Aveeno products Benadryl cream or 25mg  every 6 hours as needed Calamine Lotion 1% cortisone cream  Yeast  infection: Gyne-lotrimin 7 Monistat 7   **If taking multiple medications, please check labels to avoid duplicating the same active ingredients **take medication as directed on the label ** Do not exceed 4000 mg of tylenol in 24 hours **Do not take medications that contain aspirin or ibuprofen

## 2022-03-09 NOTE — Progress Notes (Signed)
Patient was seen on 03/09/2022 for Gestational Diabetes self-management class at the [facility]. The following learning objectives were met by the patient during this course:  States the definition of Gestational Diabetes States why dietary management is important in controlling blood glucose Describes the effects each nutrient has on blood glucose levels Demonstrates ability to create a balanced meal plan Demonstrates carbohydrate counting  States when to check blood glucose levels Demonstrates proper blood glucose monitoring techniques States the effect of stress and exercise on blood glucose levels States the importance of limiting caffeine and abstaining from alcohol and smoking  Blood glucose monitor given: contour next gen Lot # [id number] Exp: 2023-02-05 Blood glucose reading: 81 6+ hours post prandial  Pt states she is stressed she has to make dietary changes and believes it is going to be difficult for her.   Patient instructed to monitor glucose levels: FBS: 60 - <90 1 hour: <140 2 hour: <120  *Patient received handouts: Nutrition Diabetes and Pregnancy Carbohydrate Counting List  Patient will be seen for follow-up as needed.

## 2022-04-01 ENCOUNTER — Inpatient Hospital Stay (HOSPITAL_COMMUNITY)
Admission: AD | Admit: 2022-04-01 | Discharge: 2022-04-01 | Disposition: A | Discharge status: Home / Self Care | Payer: BC Managed Care – PPO | Attending: Obstetrics and Gynecology | Admitting: Obstetrics and Gynecology

## 2022-04-01 ENCOUNTER — Encounter (HOSPITAL_COMMUNITY): Payer: Self-pay | Admitting: Obstetrics and Gynecology

## 2022-04-01 DIAGNOSIS — O09523 Supervision of elderly multigravida, third trimester: Secondary | ICD-10-CM | POA: Insufficient documentation

## 2022-04-01 DIAGNOSIS — O99891 Other specified diseases and conditions complicating pregnancy: Secondary | ICD-10-CM | POA: Diagnosis not present

## 2022-04-01 DIAGNOSIS — M5431 Sciatica, right side: Secondary | ICD-10-CM

## 2022-04-01 DIAGNOSIS — O26893 Other specified pregnancy related conditions, third trimester: Secondary | ICD-10-CM | POA: Insufficient documentation

## 2022-04-01 DIAGNOSIS — O23593 Infection of other part of genital tract in pregnancy, third trimester: Secondary | ICD-10-CM | POA: Diagnosis not present

## 2022-04-01 DIAGNOSIS — Z3A32 32 weeks gestation of pregnancy: Secondary | ICD-10-CM | POA: Insufficient documentation

## 2022-04-01 DIAGNOSIS — R109 Unspecified abdominal pain: Secondary | ICD-10-CM

## 2022-04-01 LAB — URINALYSIS, ROUTINE W REFLEX MICROSCOPIC
Bilirubin Urine: NEGATIVE
Glucose, UA: NEGATIVE mg/dL
Hgb urine dipstick: NEGATIVE
Ketones, ur: 20 mg/dL — AB
Leukocytes,Ua: NEGATIVE
Nitrite: NEGATIVE
Protein, ur: NEGATIVE mg/dL
Specific Gravity, Urine: 1.016 (ref 1.005–1.030)
pH: 5 (ref 5.0–8.0)

## 2022-04-01 MED ORDER — CYCLOBENZAPRINE HCL 10 MG PO TABS: 10.0000 mg | ORAL_TABLET | Freq: Three times a day (TID) | ORAL | 0 refills | Status: DC | PRN

## 2022-04-01 NOTE — MAU Note (Signed)
.  SAFIYAH CISNEY is a 41 y.o. at [redacted]w[redacted]d here in MAU reporting: lower back and ABD that radiates down to right foot and shooting occasionally to her neck that started Tuesday night suddenly. Pt states she went to her OB Monday for vaginal discomfort with dysuria and is being treated for a yeast infection, has 3 more days of Terazol. Pt states she has some loose stools more frequently and has not been able to eat since lunch yesterday due to the pain. Pt denies DFM, VB, LOF, CTX and complications in the pregnancy.   Onset of complaint: Tuesday  Pain score: 10/10 Vitals:   04/01/22 0540  BP: 106/73  Pulse: (!) 108  Resp: 20  Temp: 98 F (36.7 C)  SpO2: 97%     FHT:120  Lab orders placed from triage:  UA

## 2022-04-01 NOTE — MAU Provider Note (Signed)
Chief Complaint:  Abdominal Pain and Back Pain   Event Date/Time   First Provider Initiated Contact with Patient 04/01/22 0600     HPI: Kathleen Hendricks is a 41 y.o. G2P0010 at 23w5dwho presents to maternity admissions reporting intermittent sharp pains in abdomen that origninate in her lower back and wrap around.  Starting having sharp back pains with radiation into neck and legs on Tuesday.  Seen in office and treated for yeast infection.  . She reports good fetal movement, denies LOF, vaginal bleeding, vaginal itching/burning, urinary symptoms, h/a, dizziness, n/v, diarrhea, constipation or fever/chills.  She denies headache, visual changes or RUQ abdominal pain.  Abdominal Pain This is a new problem. The current episode started in the past 7 days. The problem occurs intermittently. Pain location: moves around from side to side. The quality of the pain is sharp. Pain radiation: origninates in low back/sciatic region. Pertinent negatives include no anorexia, constipation, diarrhea, dysuria, fever, frequency, myalgias, nausea or vomiting. Nothing aggravates the pain. The pain is relieved by Nothing. She has tried nothing for the symptoms.  Back Pain This is a recurrent problem. The current episode started in the past 7 days. The problem occurs intermittently. The pain is present in the lumbar spine and sacro-iliac. The quality of the pain is described as stabbing, shooting and cramping. The symptoms are aggravated by bending and twisting. Stiffness is present All day. Associated symptoms include abdominal pain. Pertinent negatives include no dysuria, fever, numbness, paresis, pelvic pain or weakness. She has tried nothing for the symptoms.   RN Note: MARGERET Hendricks is a 41 y.o. at [redacted]w[redacted]d here in MAU reporting: lower back and ABD that radiates down to right foot and shooting occasionally to her neck that started Tuesday night suddenly. Pt states she went to her OB Monday for vaginal discomfort with  dysuria and is being treated for a yeast infection, has 3 more days of Terazol. Pt states she has some loose stools more frequently and has not been able to eat since lunch yesterday due to the pain. Pt denies DFM, VB, LOF, CTX and complications in the pregnancy.   Past Medical History: Past Medical History:  Diagnosis Date   ANXIETY 03/24/2007   Qualifier: Diagnosis of  By: Jonny Ruiz MD, Len Blalock    Common migraine 03/21/2007   Qualifier: Diagnosis of  By: Maris Berger    DEPRESSION 03/24/2007   Qualifier: Diagnosis of  By: Jonny Ruiz MD, Len Blalock    Headache(784.0)    Obesity (BMI 35.0-39.9 without comorbidity)    Obesity, unspecified 04/02/2014   Other and unspecified hyperlipidemia 04/02/2014    Past obstetric history: OB History  Gravida Para Term Preterm AB Living  2       1    SAB IAB Ectopic Multiple Live Births  1            # Outcome Date GA Lbr Len/2nd Weight Sex Delivery Anes PTL Lv  2 Current           1 SAB             Past Surgical History: Past Surgical History:  Procedure Laterality Date   CARPAL TUNNEL RELEASE     GANGLION CYST EXCISION     left wrist   PILONIDAL CYST EXCISION      Family History: Family History  Problem Relation Age of Onset   Diabetes Mother    Hypertension Mother    Hyperlipidemia Mother    Heart disease  Mother        CAD   Arthritis Mother        back surgery   Hypertension Father    Hyperlipidemia Father    Heart disease Father        CAD   Cancer Paternal Grandmother        breast    Social History: Social History   Tobacco Use   Smoking status: Never   Smokeless tobacco: Never  Vaping Use   Vaping Use: Never used  Substance Use Topics   Alcohol use: No   Drug use: No    Allergies: No Known Allergies  Meds:  Medications Prior to Admission  Medication Sig Dispense Refill Last Dose   aspirin EC 81 MG tablet Take 81 mg by mouth daily. Swallow whole.   03/31/2022   Prenatal Vit-Fe Fumarate-FA (PRENATAL  MULTIVITAMIN) TABS tablet Take 1 tablet by mouth daily at 12 noon.   03/31/2022   albuterol (VENTOLIN HFA) 108 (90 Base) MCG/ACT inhaler Inhale 2 puffs into the lungs every 6 (six) hours as needed for wheezing or shortness of breath. 8 g 0    cyclobenzaprine (FLEXERIL) 10 MG tablet Take 1 tablet (10 mg total) by mouth 3 (three) times daily as needed for muscle spasms. 30 tablet 0     I have reviewed patient's Past Medical Hx, Surgical Hx, Family Hx, Social Hx, medications and allergies.   ROS:  Review of Systems  Constitutional:  Negative for fever.  Gastrointestinal:  Positive for abdominal pain. Negative for anorexia, constipation, diarrhea, nausea and vomiting.  Genitourinary:  Negative for dysuria, frequency and pelvic pain.  Musculoskeletal:  Positive for back pain. Negative for myalgias.  Neurological:  Negative for weakness and numbness.   Other systems negative  Physical Exam  Patient Vitals for the past 24 hrs:  BP Temp Temp src Pulse Resp SpO2 Height Weight  04/01/22 0555 136/86 -- -- (!) 106 -- 98 % -- --  04/01/22 0540 106/73 98 F (36.7 C) Oral (!) 108 20 97 % 5\' 6"  (1.676 m) 107.5 kg   Constitutional: Well-developed, well-nourished female in no acute distress.  Cardiovascular: normal rate and rhythm Respiratory: normal effort, clear to auscultation bilaterally GI: Abd soft, non-tender, gravid appropriate for gestational age.   No rebound or guarding. MS: Extremities nontender, no edema, normal ROM Neurologic: Alert and oriented x 4.  GU: Neg CVAT.  PELVIC EXAM: Dilation: Closed Effacement (%): Thick Cervical Position: Posterior Exam by:: [email or web address], CNM    FHT:  Baseline 120 , moderate variability, small accelerations present, no decelerations Contractions: Occasional    Labs: Results for orders placed or performed during the hospital encounter of 04/01/22 (from the past 24 hour(s))  Urinalysis, Routine w reflex microscopic     Status: Abnormal    Collection Time: 04/01/22  5:29 AM  Result Value Ref Range   Color, Urine YELLOW YELLOW   APPearance CLOUDY (A) CLEAR   Specific Gravity, Urine 1.016 1.005 - 1.030   pH 5.0 5.0 - 8.0   Glucose, UA NEGATIVE NEGATIVE mg/dL   Hgb urine dipstick NEGATIVE NEGATIVE   Bilirubin Urine NEGATIVE NEGATIVE   Ketones, ur 20 (A) NEGATIVE mg/dL   Protein, ur NEGATIVE NEGATIVE mg/dL   Nitrite NEGATIVE NEGATIVE   Leukocytes,Ua NEGATIVE NEGATIVE    Imaging:  No results found.  MAU Course/MDM: I have reviewed the triage vital signs and the nursing notes.   Pertinent labs & imaging results that were available during my care  of the patient were reviewed by me and considered in my medical decision making (see chart for details).      I have reviewed her medical records including past results, notes and treatments.   I have ordered labs and reviewed results.  NST reviewed, reassuring for gestational age Treatments in MAU included EFM,  Will Rx Flexeril since patient is driving.    Assessment: [place] IUP at [redacted]w[redacted]d Intermittent sharp abdominal pain referred from low back pain Low back pain Sciatica  Plan: Discharge home Rx Flexeril for back spasms Recommend Physical Therapy to augment Flexeril  Preterm Labor precautions and fetal kick counts Follow up in Office for prenatal visits, has appt today Encouraged to return if she develops worsening of symptoms, increase in pain, fever, or other concerning symptoms.  Pt stable at time of discharge.  Wynelle Bourgeois CNM, MSN Certified Nurse-Midwife 04/01/2022 6:01 AM

## 2022-04-28 LAB — OB RESULTS CONSOLE GBS: GBS: NEGATIVE

## 2022-05-05 ENCOUNTER — Telehealth (HOSPITAL_COMMUNITY): Payer: Self-pay | Admitting: *Deleted

## 2022-05-05 NOTE — Telephone Encounter (Signed)
Preadmission screen  

## 2022-05-12 ENCOUNTER — Encounter (HOSPITAL_COMMUNITY): Payer: Self-pay

## 2022-05-12 DIAGNOSIS — L02224 Furuncle of groin: Secondary | ICD-10-CM | POA: Insufficient documentation

## 2022-05-13 ENCOUNTER — Telehealth (HOSPITAL_COMMUNITY): Payer: Self-pay | Admitting: *Deleted

## 2022-05-13 NOTE — Telephone Encounter (Signed)
Preadmission screen  

## 2022-05-16 ENCOUNTER — Encounter (HOSPITAL_COMMUNITY): Payer: Self-pay | Admitting: *Deleted

## 2022-05-16 ENCOUNTER — Telehealth (HOSPITAL_COMMUNITY): Payer: Self-pay | Admitting: *Deleted

## 2022-05-16 NOTE — Telephone Encounter (Signed)
Preadmission screen  

## 2022-05-19 ENCOUNTER — Inpatient Hospital Stay (HOSPITAL_COMMUNITY): Payer: BC Managed Care – PPO

## 2022-05-19 ENCOUNTER — Inpatient Hospital Stay (HOSPITAL_COMMUNITY)
Admission: AD | Admit: 2022-05-19 | Discharge: 2022-05-23 | DRG: 788 | Disposition: A | Discharge status: Home / Self Care | Payer: BC Managed Care – PPO | Attending: Obstetrics and Gynecology | Admitting: Obstetrics and Gynecology

## 2022-05-19 ENCOUNTER — Inpatient Hospital Stay (HOSPITAL_COMMUNITY): Payer: BC Managed Care – PPO | Admitting: Anesthesiology

## 2022-05-19 DIAGNOSIS — O4292 Full-term premature rupture of membranes, unspecified as to length of time between rupture and onset of labor: Secondary | ICD-10-CM | POA: Diagnosis present

## 2022-05-19 DIAGNOSIS — O24414 Gestational diabetes mellitus in pregnancy, insulin controlled: Principal | ICD-10-CM | POA: Diagnosis present

## 2022-05-19 DIAGNOSIS — O3663X0 Maternal care for excessive fetal growth, third trimester, not applicable or unspecified: Secondary | ICD-10-CM | POA: Diagnosis present

## 2022-05-19 DIAGNOSIS — O99214 Obesity complicating childbirth: Secondary | ICD-10-CM | POA: Diagnosis present

## 2022-05-19 DIAGNOSIS — Z3A39 39 weeks gestation of pregnancy: Secondary | ICD-10-CM

## 2022-05-19 DIAGNOSIS — Z3490 Encounter for supervision of normal pregnancy, unspecified, unspecified trimester: Secondary | ICD-10-CM

## 2022-05-19 DIAGNOSIS — O24424 Gestational diabetes mellitus in childbirth, insulin controlled: Secondary | ICD-10-CM | POA: Diagnosis present

## 2022-05-19 LAB — GLUCOSE, CAPILLARY
Glucose-Capillary: 155 mg/dL — ABNORMAL HIGH (ref 70–99)
Glucose-Capillary: 122 mg/dL — ABNORMAL HIGH (ref 70–99)
Glucose-Capillary: 76 mg/dL (ref 70–99)
Glucose-Capillary: 147 mg/dL — ABNORMAL HIGH (ref 70–99)
Glucose-Capillary: 103 mg/dL — ABNORMAL HIGH (ref 70–99)
Glucose-Capillary: 91 mg/dL (ref 70–99)
Glucose-Capillary: 97 mg/dL (ref 70–99)

## 2022-05-19 LAB — CBC
HCT: 41.5 % (ref 36.0–46.0)
Hemoglobin: 13.3 g/dL (ref 12.0–15.0)
MCH: 28 pg (ref 26.0–34.0)
MCHC: 32 g/dL (ref 30.0–36.0)
MCV: 87.4 fL (ref 80.0–100.0)
Platelets: 238 10*3/uL (ref 150–400)
RBC: 4.75 MIL/uL (ref 3.87–5.11)
RDW: 14.6 % (ref 11.5–15.5)
WBC: 10.9 10*3/uL — ABNORMAL HIGH (ref 4.0–10.5)
nRBC: 0 % (ref 0.0–0.2)

## 2022-05-19 LAB — RPR: RPR Ser Ql: NONREACTIVE

## 2022-05-19 LAB — TYPE AND SCREEN
ABO/RH(D): O POS
Antibody Screen: NEGATIVE

## 2022-05-19 LAB — HEMOGLOBIN A1C
Hgb A1c MFr Bld: 5.7 % — ABNORMAL HIGH (ref 4.8–5.6)
Mean Plasma Glucose: 116.89 mg/dL

## 2022-05-19 MED ORDER — DEXTROSE 50 % IV SOLN: 0.0000 mL | INTRAVENOUS | Status: DC | PRN

## 2022-05-19 MED ORDER — DIPHENHYDRAMINE HCL 50 MG/ML IJ SOLN: 12.5000 mg | INTRAMUSCULAR | Status: DC | PRN

## 2022-05-19 MED ORDER — TERBUTALINE SULFATE 1 MG/ML IJ SOLN: 0.2500 mg | Freq: Once | INTRAMUSCULAR | Status: DC | PRN

## 2022-05-19 MED ORDER — LACTATED RINGERS IV SOLN: INTRAVENOUS | Status: DC

## 2022-05-19 MED ORDER — EPHEDRINE 5 MG/ML INJ: 10.0000 mg | INTRAVENOUS | Status: DC | PRN

## 2022-05-19 MED ORDER — OXYTOCIN BOLUS FROM INFUSION: 333.0000 mL | Freq: Once | INTRAVENOUS | Status: DC

## 2022-05-19 MED ORDER — FENTANYL-BUPIVACAINE-NACL 0.5-0.125-0.9 MG/250ML-% EP SOLN: 12.0000 mL/h | EPIDURAL | Status: DC | PRN

## 2022-05-19 MED ORDER — FENTANYL CITRATE (PF) 100 MCG/2ML IJ SOLN
100.0000 ug | INTRAMUSCULAR | Status: DC | PRN
  Administered 2022-05-19 (×2): 100 ug via INTRAVENOUS
  Filled 2022-05-19 (×2): qty 2

## 2022-05-19 MED ORDER — OXYTOCIN-SODIUM CHLORIDE 30-0.9 UT/500ML-% IV SOLN
1.0000 m[IU]/min | INTRAVENOUS | Status: DC
  Administered 2022-05-19: 2 m[IU]/min via INTRAVENOUS
  Filled 2022-05-19: qty 500

## 2022-05-19 MED ORDER — ONDANSETRON HCL 4 MG/2ML IJ SOLN: 4.0000 mg | Freq: Four times a day (QID) | INTRAMUSCULAR | Status: DC | PRN

## 2022-05-19 MED ORDER — OXYTOCIN-SODIUM CHLORIDE 30-0.9 UT/500ML-% IV SOLN
1.0000 m[IU]/min | INTRAVENOUS | Status: DC
  Filled 2022-05-19: qty 500

## 2022-05-19 MED ORDER — OXYCODONE-ACETAMINOPHEN 5-325 MG PO TABS: 1.0000 | ORAL_TABLET | ORAL | Status: DC | PRN

## 2022-05-19 MED ORDER — FENTANYL-BUPIVACAINE-NACL 0.5-0.125-0.9 MG/250ML-% EP SOLN
12.0000 mL/h | EPIDURAL | Status: DC | PRN
  Administered 2022-05-19 – 2022-05-20 (×2): 12 mL/h via EPIDURAL
  Filled 2022-05-19 (×2): qty 250

## 2022-05-19 MED ORDER — LACTATED RINGERS IV SOLN: 500.0000 mL | Freq: Once | INTRAVENOUS | Status: DC

## 2022-05-19 MED ORDER — SOD CITRATE-CITRIC ACID 500-334 MG/5ML PO SOLN: 30.0000 mL | ORAL | Status: DC | PRN

## 2022-05-19 MED ORDER — LACTATED RINGERS IV SOLN: 500.0000 mL | INTRAVENOUS | Status: DC | PRN

## 2022-05-19 MED ORDER — INSULIN REGULAR(HUMAN) IN NACL 100-0.9 UT/100ML-% IV SOLN
INTRAVENOUS | Status: DC
  Filled 2022-05-19: qty 100

## 2022-05-19 MED ORDER — LIDOCAINE HCL (PF) 1 % IJ SOLN
INTRAMUSCULAR | Status: DC | PRN
  Administered 2022-05-19: 6 mL via EPIDURAL

## 2022-05-19 MED ORDER — OXYCODONE-ACETAMINOPHEN 5-325 MG PO TABS: 2.0000 | ORAL_TABLET | ORAL | Status: DC | PRN

## 2022-05-19 MED ORDER — ACETAMINOPHEN 325 MG PO TABS
650.0000 mg | ORAL_TABLET | ORAL | Status: DC | PRN
  Administered 2022-05-19: 650 mg via ORAL
  Filled 2022-05-19: qty 2

## 2022-05-19 MED ORDER — MISOPROSTOL 25 MCG QUARTER TABLET
25.0000 ug | ORAL_TABLET | ORAL | Status: DC | PRN
  Administered 2022-05-19: 25 ug via VAGINAL
  Filled 2022-05-19 (×2): qty 1

## 2022-05-19 MED ORDER — SOD CITRATE-CITRIC ACID 500-334 MG/5ML PO SOLN
30.0000 mL | ORAL | Status: DC | PRN
  Administered 2022-05-20: 30 mL via ORAL
  Filled 2022-05-19: qty 30

## 2022-05-19 MED ORDER — OXYTOCIN-SODIUM CHLORIDE 30-0.9 UT/500ML-% IV SOLN: 2.5000 [IU]/h | INTRAVENOUS | Status: DC

## 2022-05-19 MED ORDER — PHENYLEPHRINE 80 MCG/ML (10ML) SYRINGE FOR IV PUSH (FOR BLOOD PRESSURE SUPPORT): 80.0000 ug | PREFILLED_SYRINGE | INTRAVENOUS | Status: DC | PRN

## 2022-05-19 MED ORDER — DEXTROSE IN LACTATED RINGERS 5 % IV SOLN: INTRAVENOUS | Status: DC

## 2022-05-19 MED ORDER — INSULIN REGULAR(HUMAN) IN NACL 100-0.9 UT/100ML-% IV SOLN
INTRAVENOUS | Status: DC
  Filled 2022-05-19 (×2): qty 100

## 2022-05-19 MED ORDER — LIDOCAINE-EPINEPHRINE (PF) 1.5 %-1:200000 IJ SOLN
INTRAMUSCULAR | Status: DC | PRN
  Administered 2022-05-19: 3 mL via PERINEURAL

## 2022-05-19 MED ORDER — LIDOCAINE HCL (PF) 1 % IJ SOLN: 30.0000 mL | INTRAMUSCULAR | Status: DC | PRN

## 2022-05-19 MED ORDER — ACETAMINOPHEN 325 MG PO TABS: 650.0000 mg | ORAL_TABLET | ORAL | Status: DC | PRN

## 2022-05-19 NOTE — Progress Notes (Signed)
Patient feeling intermittent mild contractions.  We reviewed the importance of good sugar control for fetal wellbeing, she agrees to proceed with EndoTool if additional blood sugar >120 FHT: 125 reactive TOCO: irregular SVE: deferred given ruptured status and pt declining cervical check A/P: IOL for AMA/GDMA2 Start EndoTool for any additional elevated BG >120 Continue pitocin 2x2, pt currently at 36 Advised once max for hour, if pt status unchanged, can take short pit break and have light labor snack, then restart pitocin and again titrate up Based on exam and recent US, anticipate weight 8.5# Can consider adding abx at 18-24 hrs ruptured Cont other routine mgmt

## 2022-05-19 NOTE — Progress Notes (Signed)
Diabetic coordinator consulted by RN for further recommendation of glucose management for pt.    Discussed with pt increasing fluid intake, restricting carbohydrate intake and a fluid bolus to help bring sugar down.  Discussed with pt the risks to neonate of hyperglycemia in addition to the ability of EndoTool to provide tight glucose control to help with newborn outcomes.  Pt continues to be concerned about IV placement and "dying in a coma" from sugar that is too low.   Discussed how current management strategies make it difficult to keep her sugars in an appropriate range and addition of the EndoTool would help ensure she is not too low as well as too hight.  Pt verbalizes understanding.  Declines meeting with MD and Diabetic coordinator at this time.    Agreeable to additional fluid intake and retaking BS in 2 hours.

## 2022-05-19 NOTE — Anesthesia Preprocedure Evaluation (Signed)
Anesthesia Evaluation  Patient identified by MRN, date of birth, ID band Patient awake    Reviewed: Allergy & Precautions, H&P , NPO status , Patient's Chart, lab work & pertinent test results  History of Anesthesia Complications Negative for: history of anesthetic complications  Airway Mallampati: II  TM Distance: >3 FB     Dental   Pulmonary neg pulmonary ROS,    Pulmonary exam normal        Cardiovascular negative cardio ROS   Rhythm:regular Rate:Normal     Neuro/Psych negative neurological ROS  negative psych ROS   GI/Hepatic negative GI ROS, Neg liver ROS,   Endo/Other  diabetes, GestationalMorbid obesity  Renal/GU      Musculoskeletal   Abdominal   Peds  Hematology negative hematology ROS (+)   Anesthesia Other Findings   Reproductive/Obstetrics (+) Pregnancy                             Anesthesia Physical Anesthesia Plan  ASA: 3  Anesthesia Plan: Epidural   Post-op Pain Management:    Induction:   PONV Risk Score and Plan:   Airway Management Planned:   Additional Equipment:   Intra-op Plan:   Post-operative Plan:   Informed Consent: I have reviewed the patients History and Physical, chart, labs and discussed the procedure including the risks, benefits and alternatives for the proposed anesthesia with the patient or authorized representative who has indicated his/her understanding and acceptance.       Plan Discussed with:   Anesthesia Plan Comments:         Anesthesia Quick Evaluation  

## 2022-05-19 NOTE — Progress Notes (Signed)
Pt again educated on recommendations for glucose control and labor and implications to neonate postpartum of hyperglycemia.  Pt again verbalizes understanding and again declines insulin management.  MD notified

## 2022-05-19 NOTE — Progress Notes (Addendum)
Discussed POC and glucose control with Pt.  Pt unsure about insulin use.  Risk of maternal hyperglycemia discussed with pt.  Options for sliding scale and IV EndoTool management discussed.  Pt verbalizes understanding, remains unsure stating that she worries that her glucose may go too low if she isn't eating and we give her insulin.  Pt educated on glucose control in labor, use of EndoTool and recommendation on glucose control in labor.  Pt again verbalizes understanding and continues to decline insulin at this time.    MD notified

## 2022-05-19 NOTE — H&P (Signed)
41 y.o. [redacted]w[redacted]d  G2P0010 comes in for schedule IOL for AMA, GDMA2.  Otherwise has good fetal movement and no bleeding.  Past Medical History:  Diagnosis Date   ANXIETY 03/24/2007   Qualifier: Diagnosis of  By: Jenny Reichmann MD, Hunt Oris    Common migraine 03/21/2007   Qualifier: Diagnosis of  By: Elveria Royals    Complication of anesthesia    woke up during surgery trying to get off table   DEPRESSION 03/24/2007   Qualifier: Diagnosis of  By: Jenny Reichmann MD, Hunt Oris    Gestational diabetes    Headache(784.0)    Obesity (BMI 35.0-39.9 without comorbidity)    Obesity, unspecified 04/02/2014   Other and unspecified hyperlipidemia 04/02/2014    Past Surgical History:  Procedure Laterality Date   CARPAL TUNNEL RELEASE     GANGLION CYST EXCISION     left wrist   PILONIDAL CYST EXCISION      OB History  Gravida Para Term Preterm AB Living  2       1    SAB IAB Ectopic Multiple Live Births  1            # Outcome Date GA Lbr Len/2nd Weight Sex Delivery Anes PTL Lv  2 Current           1 SAB             Social History   Socioeconomic History   Marital status: Single    Spouse name: Not on file   Number of children: Not on file   Years of education: 18   Highest education level: Not on file  Occupational History   Not on file  Tobacco Use   Smoking status: Never   Smokeless tobacco: Never  Vaping Use   Vaping Use: Never used  Substance and Sexual Activity   Alcohol use: No   Drug use: No   Sexual activity: Not Currently  Other Topics Concern   Not on file  Social History Narrative   [employer], [employer] - BA. Some grad school.  Single. Lives alone. Works - [employer]   Social Determinants of Radio broadcast assistant Strain: Not on [employer] Insecurity: No Food Insecurity (05/19/2022)   Hunger Vital Sign    Worried About Running Out of Food in the Last Year: Never true    Ran Out of Food in the Last Year: Never true  Transportation Needs: No Transportation  Needs (05/19/2022)   PRAPARE - Hydrologist (Medical): No    Lack of Transportation (Non-Medical): No  Physical Activity: Not on file  Stress: Not on file  Social Connections: Not on file  Intimate Partner Violence: Not At Risk (05/19/2022)   Humiliation, Afraid, Rape, and Kick questionnaire    Fear of Current or Ex-Partner: No    Emotionally Abused: No    Physically Abused: No    Sexually Abused: No   Patient has no known allergies.    Prenatal Transfer Tool  Maternal Diabetes: Yes:  Diabetes Type:  Insulin/Medication controlled Genetic Screening: Normal Maternal Ultrasounds/Referrals: Normal Fetal Ultrasounds or other Referrals:  None Maternal Substance Abuse:  No Significant Maternal Medications:  Meds include: Other: insulin Significant Maternal Lab Results: Group B Strep negative  Other PNC: AMA, obesity, GMDA2.  [place] 9/15: EFW 72% (6#13), repeat 9/28 EFW 56% (7#1)    Vitals:   05/19/22 0906 05/19/22 0914 05/19/22 0932 05/19/22 1002  BP: 135/85 123/78 Marland Kitchen)  129/94 (!) 93/54  Pulse: 96 (!) 104 (!) 101 89  Resp:    18  Temp:    97.9 F (36.6 C)  TempSrc:    Oral  Weight:      Height:        Lungs/Cor:  NAD Abdomen:  soft, gravid Ex:  no cords, erythema SVE:  0/Th - vtx confirmed by [place] FHTs:  120, good STV, one recent accel, noted decel around 4am, overall reassuring Toco:  q irreg   A/P   admitted overnight for IOL G2P0 at 39.4 for AMA, GDMA2  GBS Neg  Discussed glucose monitoring and mgmt with RN, she stated pt was upset with need for another IV placement and was explained by tight control was necessary for fetal wellbeing.  Per RN pt was considering.  S/p cyto x 1, SROM, verfied vtx by bedside US, pitcon started 2x2  Other routine care  Philip Aspen

## 2022-05-19 NOTE — Anesthesia Procedure Notes (Signed)
Epidural Patient location during procedure: OB Start time: 05/19/2022 11:07 PM End time: 05/19/2022 11:19 PM  Staffing Anesthesiologist: Lidia Collum, MD Performed: anesthesiologist   Preanesthetic Checklist Completed: patient identified, IV checked, risks and benefits discussed, monitors and equipment checked, pre-op evaluation and timeout performed  Epidural Patient position: sitting Prep: DuraPrep Patient monitoring: heart rate, continuous pulse ox and blood pressure Approach: midline Location: L3-L4 Injection technique: LOR air  Needle:  Needle type: Tuohy  Needle gauge: 17 G Needle length: 9 cm Needle insertion depth: 9 cm Catheter type: closed end flexible Catheter size: 19 Gauge Catheter at skin depth: 15 cm Test dose: negative and 1.5% lidocaine with Epi 1:200 K  Assessment Events: blood not aspirated, injection not painful, no injection resistance, no paresthesia and negative IV test  Additional Notes Smooth epidural placement, however patient movement was significant during placement. Small amount of blood in catheter after threading but did not aspirate, test dose with epi negative prior to starting infusion.Reason for block:procedure for pain

## 2022-05-19 NOTE — Progress Notes (Addendum)
Glucose control in labor again discussed with pt.  She verbalized understanding and continues to decline insulin use either by EndoTool because of the need to place second IV and SubQ insulin because she feels like she does not need it at this time.    MD notified.

## 2022-05-20 ENCOUNTER — Encounter (HOSPITAL_COMMUNITY): Payer: Self-pay | Admitting: Obstetrics and Gynecology

## 2022-05-20 ENCOUNTER — Encounter (HOSPITAL_COMMUNITY)
Admission: AD | Disposition: A | Discharge status: Home / Self Care | Payer: Self-pay | Source: Home / Self Care | Attending: Obstetrics and Gynecology

## 2022-05-20 ENCOUNTER — Other Ambulatory Visit: Payer: Self-pay

## 2022-05-20 DIAGNOSIS — O24424 Gestational diabetes mellitus in childbirth, insulin controlled: Secondary | ICD-10-CM | POA: Diagnosis not present

## 2022-05-20 DIAGNOSIS — Z3A39 39 weeks gestation of pregnancy: Secondary | ICD-10-CM | POA: Diagnosis not present

## 2022-05-20 LAB — CBC
HCT: 33.8 % — ABNORMAL LOW (ref 36.0–46.0)
Hemoglobin: 11.5 g/dL — ABNORMAL LOW (ref 12.0–15.0)
MCH: 28.6 pg (ref 26.0–34.0)
MCHC: 34 g/dL (ref 30.0–36.0)
MCV: 84.1 fL (ref 80.0–100.0)
Platelets: 191 10*3/uL (ref 150–400)
RBC: 4.02 MIL/uL (ref 3.87–5.11)
RDW: 14.1 % (ref 11.5–15.5)
WBC: 15.3 10*3/uL — ABNORMAL HIGH (ref 4.0–10.5)
nRBC: 0 % (ref 0.0–0.2)

## 2022-05-20 LAB — GLUCOSE, CAPILLARY
Glucose-Capillary: 101 mg/dL — ABNORMAL HIGH (ref 70–99)
Glucose-Capillary: 103 mg/dL — ABNORMAL HIGH (ref 70–99)
Glucose-Capillary: 110 mg/dL — ABNORMAL HIGH (ref 70–99)
Glucose-Capillary: 111 mg/dL — ABNORMAL HIGH (ref 70–99)
Glucose-Capillary: 112 mg/dL — ABNORMAL HIGH (ref 70–99)
Glucose-Capillary: 112 mg/dL — ABNORMAL HIGH (ref 70–99)
Glucose-Capillary: 125 mg/dL — ABNORMAL HIGH (ref 70–99)
Glucose-Capillary: 97 mg/dL (ref 70–99)

## 2022-05-20 LAB — CREATININE, SERUM
Creatinine, Ser: 0.7 mg/dL (ref 0.44–1.00)
GFR, Estimated: >60 mL/min (ref >60)

## 2022-05-20 SURGERY — Surgical Case
Anesthesia: Epidural

## 2022-05-20 MED ORDER — CEFAZOLIN SODIUM-DEXTROSE 2-4 GM/100ML-% IV SOLN
INTRAVENOUS | Status: AC
  Filled 2022-05-20: qty 100

## 2022-05-20 MED ORDER — FENTANYL CITRATE (PF) 100 MCG/2ML IJ SOLN
INTRAMUSCULAR | Status: AC
  Filled 2022-05-20: qty 2

## 2022-05-20 MED ORDER — LIDOCAINE-EPINEPHRINE (PF) 2 %-1:200000 IJ SOLN
INTRAMUSCULAR | Status: DC | PRN
  Administered 2022-05-20 (×2): 5 mL via EPIDURAL

## 2022-05-20 MED ORDER — MORPHINE SULFATE (PF) 0.5 MG/ML IJ SOLN
INTRAMUSCULAR | Status: DC | PRN
  Administered 2022-05-20: 3 mg via EPIDURAL

## 2022-05-20 MED ORDER — DEXMEDETOMIDINE HCL IN NACL 80 MCG/20ML IV SOLN
INTRAVENOUS | Status: DC | PRN
  Administered 2022-05-20: 12 ug via BUCCAL

## 2022-05-20 MED ORDER — ACETAMINOPHEN 500 MG PO TABS: 1000.0000 mg | ORAL_TABLET | Freq: Four times a day (QID) | ORAL | Status: DC

## 2022-05-20 MED ORDER — NALOXONE HCL 4 MG/10ML IJ SOLN: 1.0000 ug/kg/h | INTRAVENOUS | Status: DC | PRN

## 2022-05-20 MED ORDER — OXYTOCIN-SODIUM CHLORIDE 30-0.9 UT/500ML-% IV SOLN: 2.5000 [IU]/h | INTRAVENOUS | Status: AC

## 2022-05-20 MED ORDER — ACETAMINOPHEN 500 MG PO TABS
1000.0000 mg | ORAL_TABLET | Freq: Once | ORAL | Status: AC
  Administered 2022-05-20: 1000 mg via ORAL

## 2022-05-20 MED ORDER — OXYCODONE HCL 5 MG PO TABS
5.0000 mg | ORAL_TABLET | ORAL | Status: DC | PRN
  Administered 2022-05-21 – 2022-05-23 (×10): 10 mg via ORAL
  Filled 2022-05-20 (×10): qty 2

## 2022-05-20 MED ORDER — ONDANSETRON HCL 4 MG/2ML IJ SOLN
INTRAMUSCULAR | Status: DC | PRN
  Administered 2022-05-20: 4 mg via INTRAVENOUS

## 2022-05-20 MED ORDER — TETANUS-DIPHTH-ACELL PERTUSSIS 5-2.5-18.5 LF-MCG/0.5 IM SUSY: 0.5000 mL | PREFILLED_SYRINGE | Freq: Once | INTRAMUSCULAR | Status: DC

## 2022-05-20 MED ORDER — DIPHENHYDRAMINE HCL 25 MG PO CAPS: 25.0000 mg | ORAL_CAPSULE | ORAL | Status: DC | PRN

## 2022-05-20 MED ORDER — ONDANSETRON HCL 4 MG/2ML IJ SOLN: 4.0000 mg | Freq: Three times a day (TID) | INTRAMUSCULAR | Status: DC | PRN

## 2022-05-20 MED ORDER — COCONUT OIL OIL: 1.0000 | TOPICAL_OIL | Status: DC | PRN

## 2022-05-20 MED ORDER — SODIUM CHLORIDE 0.9 % IR SOLN
Status: DC | PRN
  Administered 2022-05-20: 1000 mL

## 2022-05-20 MED ORDER — ACETAMINOPHEN 160 MG/5ML PO SOLN: 1000.0000 mg | Freq: Once | ORAL | Status: AC

## 2022-05-20 MED ORDER — SIMETHICONE 80 MG PO CHEW
80.0000 mg | CHEWABLE_TABLET | ORAL | Status: DC | PRN
  Administered 2022-05-22: 80 mg via ORAL
  Filled 2022-05-20: qty 1

## 2022-05-20 MED ORDER — PENICILLIN G POTASSIUM 5000000 UNITS IJ SOLR: 5.0000 10*6.[IU] | INTRAMUSCULAR | Status: DC

## 2022-05-20 MED ORDER — MORPHINE SULFATE (PF) 0.5 MG/ML IJ SOLN
INTRAMUSCULAR | Status: DC | PRN
  Administered 2022-05-20: 2 mg via INTRAVENOUS

## 2022-05-20 MED ORDER — SOD CITRATE-CITRIC ACID 500-334 MG/5ML PO SOLN: 30.0000 mL | ORAL | Status: DC

## 2022-05-20 MED ORDER — DEXAMETHASONE SODIUM PHOSPHATE 4 MG/ML IJ SOLN
INTRAMUSCULAR | Status: AC
  Filled 2022-05-20: qty 1

## 2022-05-20 MED ORDER — SODIUM CHLORIDE 0.9% FLUSH: 3.0000 mL | INTRAVENOUS | Status: DC | PRN

## 2022-05-20 MED ORDER — KETOROLAC TROMETHAMINE 30 MG/ML IJ SOLN
INTRAMUSCULAR | Status: AC
  Filled 2022-05-20: qty 1

## 2022-05-20 MED ORDER — DIPHENHYDRAMINE HCL 50 MG/ML IJ SOLN: 12.5000 mg | INTRAMUSCULAR | Status: DC | PRN

## 2022-05-20 MED ORDER — KETOROLAC TROMETHAMINE 30 MG/ML IJ SOLN: 30.0000 mg | Freq: Four times a day (QID) | INTRAMUSCULAR | Status: AC | PRN

## 2022-05-20 MED ORDER — SIMETHICONE 80 MG PO CHEW
80.0000 mg | CHEWABLE_TABLET | Freq: Three times a day (TID) | ORAL | Status: DC
  Administered 2022-05-21 – 2022-05-23 (×6): 80 mg via ORAL
  Filled 2022-05-20 (×7): qty 1

## 2022-05-20 MED ORDER — PHENYLEPHRINE 80 MCG/ML (10ML) SYRINGE FOR IV PUSH (FOR BLOOD PRESSURE SUPPORT)
PREFILLED_SYRINGE | INTRAVENOUS | Status: DC | PRN
  Administered 2022-05-20 (×2): 80 ug via INTRAVENOUS

## 2022-05-20 MED ORDER — PRENATAL MULTIVITAMIN CH
1.0000 | ORAL_TABLET | Freq: Every day | ORAL | Status: DC
  Administered 2022-05-21 – 2022-05-23 (×3): 1 via ORAL
  Filled 2022-05-20 (×4): qty 1

## 2022-05-20 MED ORDER — DIPHENHYDRAMINE HCL 25 MG PO CAPS: 25.0000 mg | ORAL_CAPSULE | Freq: Four times a day (QID) | ORAL | Status: DC | PRN

## 2022-05-20 MED ORDER — IBUPROFEN 600 MG PO TABS
600.0000 mg | ORAL_TABLET | Freq: Four times a day (QID) | ORAL | Status: DC
  Administered 2022-05-21 – 2022-05-23 (×9): 600 mg via ORAL
  Filled 2022-05-20 (×9): qty 1

## 2022-05-20 MED ORDER — SODIUM CHLORIDE 0.9 % IV SOLN
500.0000 mg | INTRAVENOUS | Status: AC
  Administered 2022-05-20: 500 mg via INTRAVENOUS
  Filled 2022-05-20: qty 5

## 2022-05-20 MED ORDER — ONDANSETRON HCL 4 MG/2ML IJ SOLN
INTRAMUSCULAR | Status: AC
  Filled 2022-05-20: qty 2

## 2022-05-20 MED ORDER — NALOXONE HCL 0.4 MG/ML IJ SOLN: 0.4000 mg | INTRAMUSCULAR | Status: DC | PRN

## 2022-05-20 MED ORDER — ACETAMINOPHEN 500 MG PO TABS
ORAL_TABLET | ORAL | Status: AC
  Filled 2022-05-20: qty 2

## 2022-05-20 MED ORDER — FENTANYL CITRATE (PF) 100 MCG/2ML IJ SOLN
25.0000 ug | INTRAMUSCULAR | Status: DC | PRN
  Administered 2022-05-20 (×2): 50 ug via INTRAVENOUS

## 2022-05-20 MED ORDER — WITCH HAZEL-GLYCERIN EX PADS: 1.0000 | MEDICATED_PAD | CUTANEOUS | Status: DC | PRN

## 2022-05-20 MED ORDER — PENICILLIN G POT IN DEXTROSE 60000 UNIT/ML IV SOLN
3.0000 10*6.[IU] | INTRAVENOUS | Status: DC
  Administered 2022-05-20: 3 10*6.[IU] via INTRAVENOUS
  Filled 2022-05-20: qty 50

## 2022-05-20 MED ORDER — DEXAMETHASONE SODIUM PHOSPHATE 4 MG/ML IJ SOLN
INTRAMUSCULAR | Status: DC | PRN
  Administered 2022-05-20: 4 mg via INTRAVENOUS

## 2022-05-20 MED ORDER — KETOROLAC TROMETHAMINE 30 MG/ML IJ SOLN
30.0000 mg | Freq: Once | INTRAMUSCULAR | Status: AC
  Administered 2022-05-20: 30 mg via INTRAVENOUS

## 2022-05-20 MED ORDER — OXYTOCIN-SODIUM CHLORIDE 30-0.9 UT/500ML-% IV SOLN
INTRAVENOUS | Status: DC | PRN
  Administered 2022-05-20: 30 [IU] via INTRAVENOUS

## 2022-05-20 MED ORDER — DROPERIDOL 2.5 MG/ML IJ SOLN: 0.6250 mg | Freq: Once | INTRAMUSCULAR | Status: DC | PRN

## 2022-05-20 MED ORDER — CEFAZOLIN SODIUM-DEXTROSE 2-4 GM/100ML-% IV SOLN
2.0000 g | INTRAVENOUS | Status: AC
  Administered 2022-05-20: 2 g via INTRAVENOUS

## 2022-05-20 MED ORDER — SENNOSIDES-DOCUSATE SODIUM 8.6-50 MG PO TABS
2.0000 | ORAL_TABLET | Freq: Every day | ORAL | Status: DC
  Administered 2022-05-21 – 2022-05-23 (×3): 2 via ORAL
  Filled 2022-05-20 (×3): qty 2

## 2022-05-20 MED ORDER — MORPHINE SULFATE (PF) 0.5 MG/ML IJ SOLN
INTRAMUSCULAR | Status: AC
  Filled 2022-05-20: qty 10

## 2022-05-20 MED ORDER — MENTHOL 3 MG MT LOZG: 1.0000 | LOZENGE | OROMUCOSAL | Status: DC | PRN

## 2022-05-20 MED ORDER — KETOROLAC TROMETHAMINE 30 MG/ML IJ SOLN
30.0000 mg | Freq: Four times a day (QID) | INTRAMUSCULAR | Status: AC
  Administered 2022-05-20 – 2022-05-21 (×3): 30 mg via INTRAVENOUS
  Filled 2022-05-20 (×3): qty 1

## 2022-05-20 MED ORDER — DIBUCAINE (PERIANAL) 1 % EX OINT: 1.0000 | TOPICAL_OINTMENT | CUTANEOUS | Status: DC | PRN

## 2022-05-20 MED ORDER — ZOLPIDEM TARTRATE 5 MG PO TABS: 5.0000 mg | ORAL_TABLET | Freq: Every evening | ORAL | Status: DC | PRN

## 2022-05-20 MED ORDER — LIDOCAINE-EPINEPHRINE (PF) 2 %-1:200000 IJ SOLN
INTRAMUSCULAR | Status: AC
  Filled 2022-05-20: qty 20

## 2022-05-20 MED ORDER — FENTANYL CITRATE (PF) 100 MCG/2ML IJ SOLN
INTRAMUSCULAR | Status: DC | PRN
  Administered 2022-05-20: 100 ug via EPIDURAL

## 2022-05-20 MED ORDER — OXYTOCIN-SODIUM CHLORIDE 30-0.9 UT/500ML-% IV SOLN
INTRAVENOUS | Status: AC
  Filled 2022-05-20: qty 500

## 2022-05-20 MED ORDER — SODIUM CHLORIDE 0.9 % IV SOLN
5.0000 10*6.[IU] | Freq: Once | INTRAVENOUS | Status: AC
  Administered 2022-05-20: 5 10*6.[IU] via INTRAVENOUS
  Filled 2022-05-20: qty 5

## 2022-05-20 MED ORDER — ACETAMINOPHEN 500 MG PO TABS
1000.0000 mg | ORAL_TABLET | Freq: Four times a day (QID) | ORAL | Status: DC
  Administered 2022-05-20 – 2022-05-23 (×11): 1000 mg via ORAL
  Filled 2022-05-20 (×11): qty 2

## 2022-05-20 MED ORDER — ENOXAPARIN SODIUM 60 MG/0.6ML IJ SOSY
60.0000 mg | PREFILLED_SYRINGE | INTRAMUSCULAR | Status: DC
  Administered 2022-05-21 – 2022-05-23 (×3): 60 mg via SUBCUTANEOUS
  Filled 2022-05-20 (×3): qty 0.6

## 2022-05-20 MED ORDER — STERILE WATER FOR IRRIGATION IR SOLN
Status: DC | PRN
  Administered 2022-05-20: 1000 mL

## 2022-05-20 SURGICAL SUPPLY — 36 items
BENZOIN TINCTURE PRP APPL 2/3 (GAUZE/BANDAGES/DRESSINGS) IMPLANT
CHLORAPREP W/TINT 26ML (MISCELLANEOUS) ×2 IMPLANT
CLAMP UMBILICAL CORD (MISCELLANEOUS) ×1 IMPLANT
CLOTH BEACON ORANGE TIMEOUT ST (SAFETY) ×1 IMPLANT
DERMABOND ADVANCED .7 DNX12 (GAUZE/BANDAGES/DRESSINGS) ×1 IMPLANT
DRSG OPSITE POSTOP 4X10 (GAUZE/BANDAGES/DRESSINGS) ×1 IMPLANT
ELECT REM PT RETURN 9FT ADLT (ELECTROSURGICAL) ×1
ELECTRODE REM PT RTRN 9FT ADLT (ELECTROSURGICAL) ×1 IMPLANT
EXTRACTOR VACUUM BELL STYLE (SUCTIONS) IMPLANT
GAUZE SPONGE 4X4 12PLY STRL LF (GAUZE/BANDAGES/DRESSINGS) IMPLANT
GLOVE BIO SURGEON STRL SZ 6.5 (GLOVE) ×1 IMPLANT
GLOVE BIOGEL PI IND STRL 6.5 (GLOVE) ×1 IMPLANT
GLOVE BIOGEL PI IND STRL 7.0 (GLOVE) ×2 IMPLANT
GOWN STRL REUS W/TWL LRG LVL3 (GOWN DISPOSABLE) ×2 IMPLANT
KIT ABG SYR 3ML LUER SLIP (SYRINGE) IMPLANT
NDL HYPO 25X5/8 SAFETYGLIDE (NEEDLE) IMPLANT
NEEDLE HYPO 25X5/8 SAFETYGLIDE (NEEDLE) IMPLANT
NS IRRIG 1000ML POUR BTL (IV SOLUTION) ×1 IMPLANT
PACK C SECTION WH (CUSTOM PROCEDURE TRAY) ×1 IMPLANT
PAD ABD 7.5X8 STRL (GAUZE/BANDAGES/DRESSINGS) IMPLANT
PAD ABD 8X10 STRL (GAUZE/BANDAGES/DRESSINGS) IMPLANT
PAD OB MATERNITY 4.3X12.25 (PERSONAL CARE ITEMS) ×1 IMPLANT
RTRCTR C-SECT PINK 25CM LRG (MISCELLANEOUS) IMPLANT
STRIP CLOSURE SKIN 1/2X4 (GAUZE/BANDAGES/DRESSINGS) IMPLANT
SUT PDS AB 0 CTX 36 PDP370T (SUTURE) IMPLANT
SUT PLAIN 2 0 (SUTURE)
SUT PLAIN ABS 2-0 CT1 27XMFL (SUTURE) IMPLANT
SUT VIC AB 0 CT1 36 (SUTURE) ×2 IMPLANT
SUT VIC AB 2-0 CT1 27 (SUTURE) ×1
SUT VIC AB 2-0 CT1 TAPERPNT 27 (SUTURE) ×1 IMPLANT
SUT VIC AB 3-0 SH 27 (SUTURE)
SUT VIC AB 3-0 SH 27X BRD (SUTURE) IMPLANT
SUT VIC AB 4-0 KS 27 (SUTURE) ×1 IMPLANT
TOWEL OR 17X24 6PK STRL BLUE (TOWEL DISPOSABLE) ×1 IMPLANT
TRAY FOLEY W/BAG SLVR 14FR LF (SET/KITS/TRAYS/PACK) ×1 IMPLANT
WATER STERILE IRR 1000ML POUR (IV SOLUTION) ×1 IMPLANT

## 2022-05-20 NOTE — Progress Notes (Addendum)
Patient getting comfortable after epidural, laying left lateral. Reviewed prior BG at q2hrs, ranges from 91-112 BP 124/74   Pulse 82   Temp 98.5 F (36.9 C) (Oral)   Resp 18   Ht 5\' 6"  (1.676 m)   Wt 117.9 kg   BMI 41.97 kg/m  Cat 1 tracing, IUPC in palced, CE 1-2/90/-3 TOCO q5-69m, pitocin at 25mU/min  Continue to titrate pitocin at 2x2. Of note, patient reached a max of 40 mU/min pitocin last night, received 1hr break around IUPC placement time (0010) and pitocin restarted. Reviewed use of IUPC with intention to measure strength of contractions. Abx initiated given >24hrs ROM (0752 10/12)  BG q4h at this time given good control and latent labor  Patient has been ruptured and on pitocin for >24hrs. Declined cervical exams during intial portion of inductions, therefore there was delay in placing IUPC. Aware of bleeding and infection risks given prolonged induction, BMI, possible LGA infant. Declining section at this time and wants to continue with pitocin restart (after midnight hour long break, was being increased by 2 every hour).  Close eye on status

## 2022-05-20 NOTE — Transfer of Care (Signed)
Immediate Anesthesia Transfer of Care Note  Patient: Kathleen Hendricks  Procedure(s) Performed: CESAREAN SECTION  Patient Location: PACU  Anesthesia Type:Epidural  Level of Consciousness: awake, alert  and oriented  Airway & Oxygen Therapy: Patient Spontanous Breathing  Post-op Assessment: Report given to RN and Post -op Vital signs reviewed and stable  Post vital signs: Reviewed and stable  Last Vitals:  Vitals Value Taken Time  BP 106/75 05/20/22 1721  Temp    Pulse 100 05/20/22 1724  Resp 20 05/20/22 1724  SpO2 100 % 05/20/22 1724  Vitals shown include unvalidated device data.  Last Pain:  Vitals:   05/20/22 1557  TempSrc:   PainSc: 0-No pain         Complications: No notable events documented.

## 2022-05-20 NOTE — Inpatient Diabetes Management (Signed)
Inpatient Diabetes Program Recommendations  Diabetes Treatment Program Recommendations  ADA Standards of Care Diabetes in Pregnancy Target Glucose Ranges:  Fasting: 70 - 95 mg/dL 1 hr postprandial: Less than 140mg /dL (from first bite of meal) 2 hr postprandial: Less than 120 mg/dL (from first bite of meal)    Lab Results  Component Value Date   GLUCAP 103 (H) 05/20/2022   HGBA1C 5.7 (H) 05/19/2022    Review of Glycemic Control  Latest Reference Range & Units 05/20/22 06:15 05/20/22 09:51 05/20/22 14:00  Glucose-Capillary 70 - 99 mg/dL 103 (H) 110 (H) 101 (H)  (H): Data is abnormally high Diabetes history: GDM Current orders for Inpatient glycemic control: CBGs Q2H Orders placed for IV insulin  Inpatient Diabetes Program Recommendations:    Noted patient and current orders for IV insulin. To infuse if patient consistently above 120 mg/dL. In agreement.   Following delivery could add Novolog 0-9 units TID & HS  Thanks, Bronson Curb, MSN, RNC-OB Diabetes Coordinator [phone] (8a-5p)

## 2022-05-20 NOTE — Progress Notes (Signed)
Pt is comfortable but tired after epidural. FHT 120 mod var +accel with scalp stim TOCO: irregualr SVE: 3/70/-1 -2 A/P: Discontinued pitocin at 40, restarted 1 hr later and will titrated up again Contractions were picking up well with patient on her back, but on her side, not well monitoring, so IUPC placed.

## 2022-05-20 NOTE — Progress Notes (Signed)
Patient remains unchanged at this time. Pitocin of 12mU/min reached at 1400, no increase beyond that point, MVUs inadequate. Afebrile. At this, PLTCS planned for failed induction of labor R/B/A of cesarean section discussed with patient. Alternative would be vaginal delivery which would mean shorter postpartum stay and decreased risk of bleeding. Risks of section include infection of the uterus, pelvic organs, or skin, inadvertent injury to internal organs, such as bowel or bladder. If there is major injury, extensive surgery may be required. If injury is minor, it may be treated with relative ease. Discussed possibility of excessive blood loss and transfusion. If bleeding cannot be controlled using medical or minor surgical methods, a cesarean hysterectomy may be performed which would mean no future fertility. Patient accepts the possibility of blood transfusion, if necessary. Patient understands and agrees to move forward with section. BP 107/61   Pulse (!) 104   Temp 98.8 F (37.1 C) (Oral)   Resp 16   Ht 5\' 6"  (1.676 m)   Wt 117.9 kg   BMI 41.97 kg/m

## 2022-05-20 NOTE — Progress Notes (Signed)
I was asked to assist in this patient's c-section. An experienced assistant was required given the standard of surgical care given the complexity of the case.  This assistant was needed for exposure, dissection, suctioning, retraction, instrument exchange, assisting with delivery with administration of fundal pressure, and for overall help during the procedure.  Gerlene Fee, DO OB Fellow, [place] [place] 05/20/2022, 5:12 PM

## 2022-05-20 NOTE — Progress Notes (Signed)
CE unchanged, afebrile. MVUs 100, pitocin at 22 mU/min, TOCO q4-11m. Patient comfortable, no increase in pelvic pressure.  Reviewed concerns regarding increased hemorrhage risk 2/2 prolonged induction, ROM>24hrs, possible LGA infant. Reviewed increase in both maternal and neonatal morbidity/mortality a/w infection, hemorrhage. Also reviewed, if labor progresses, concerns re possible LGA infant (8-9lbEFW) including significant perineal laceration (patient was almost 8lbs, husband was 9lb5oz), shoulder dystocia, possible broken clavicle/humerus, stat section. Patient understands all of these risks. Would like to continue increasing pitocin as long as "baby looks ok." Currently Cat 1 tracing, min to mod variability, occ accel, no decels  BP 123/68   Pulse (!) 104   Temp 99.4 F (37.4 C) (Oral)   Resp 16   Ht 5\' 6"  (1.676 m)   Wt 117.9 kg   BMI 41.97 kg/m

## 2022-05-20 NOTE — Op Note (Addendum)
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 Normal appearing uterus, bilateral fallopian tubes and ovaries. Specimens: placenta to path EBL 850 IVF 1500 UOP 100  Patient Course: Admitted for induction on 10/11 midnight, s/p cytotec x1 with subsequent SROM. Delinced CE, pitocin titrated up to 40 mU/min 24hrs later. AT this time, IUPC placed, pit break x1hr given, CE 1-2cm. CE remained unchanged throughout today with inadequate MVUs continuing despite pitocin up to 8mU/min. CE unchanged, reviewed risks of continuing process including increased infection and hemorrhage changes. Patient finally agreed to [facility] for failed induction of labor with ROM of 40hrs.   Consent:  R/B/A of cesarean section discussed with patient. Alternative would be vaginal delivery which would mean shorter postpartum stay and decreased risk of bleeding. Risks of section include infection of the uterus, pelvic organs, or skin, inadvertent injury to internal organs, such as bowel or bladder. If there is major injury, extensive surgery may be required. If injury is minor, it may be treated with relative ease. Discussed possibility of excessive blood loss and transfusion. If bleeding cannot be controlled using medical or minor surgical methods, a cesarean hysterectomy may be performed which would mean no future fertility. Patient accepts the possibility of blood transfusion, if necessary. Patient understands and agrees to move forward with section.   Operative Procedure: Patient was taken to the operating room where epidural anesthesia was found  to be adequate by Allis clamp test. She was prepped and draped in the normal sterile fashion in the dorsal supine position with a leftward tilt. An appropriate time out was performed. A Pfannenstiel skin incision was then made with the scalpel and carried through to the underlying layer of fascia by sharp dissection and Bovie cautery. The fascia was nicked in the midline and the incision was extended laterally with Mayo scissors. The superior aspect of the incision was grasped Coker clamps and dissected off the underlying rectus muscles. In a similar fashion the inferior aspect was dissected off the rectus muscles. Rectus muscles were separated in the midline and the peritoneal cavity entered bluntly. The peritoneal incision was then extended both superiorly and inferiorly with careful attention to avoid both bowel and bladder. The Alexis self-retaining wound retractor was then placed within the incision and the lower uterine segment exposed. The bladder flap was developed with Metzenbaum scissors and pushed away from the lower uterine segment. The lower uterine segment was then incised in a low transverse fashion and the cavity itself entered bluntly. The incision was extended bluntly. Amniotic sac was ruptured and fluid was noted to be clear in color. The infant's head was then lifted and delivered from the incision without difficulty using the standard movements. The remainder of the infant delivered and the nose and mouth bulb suctioned with the cord clamped and cut as well. The infant was handed off to NICU. The placenta was then spontaneously expressed from the uterus and the uterus cleared of all clots and debris with moist lap sponge. The uterine incision was then repaired in 2 layers the first layer was a running locked layer of 0-vicryl and the second an imbricating layer of the same suture. The  tubes and ovaries were inspected and the gutters cleared of all clots and debris. The uterine incision was  inspected and found to be hemostatic. All instruments and sponges as well as the Alexis retractor were then removed from the abdomen. The peritoneum and rectus muscles were then reapproximated with several interrupted mattress sutures of 2-0 Vicryl. The fascia was then closed with 0 Vicryl in a running fashion. Subcutaneous tissue was reapproximated with 3-0 plain in a running fashion. The skin was closed with a subcuticular stitch of 4-0 Vicryl on a Keith needle and then reinforced with Dermabond and a Honeycomb. At the conclusion of the procedure all instruments and sponge counts were correct. Patient was taken to the recovery room in good condition with her baby accompanying her skin to skin.   Antepartum DM meds: NPH 33U with breakfast, NPH 18U with dinner and Novolog 11U with each meal. Will check fasting tomorrow; does have history of A1c 6.3 at early screen.

## 2022-05-21 LAB — CBC
HCT: 31.1 % — ABNORMAL LOW (ref 36.0–46.0)
Hemoglobin: 10.1 g/dL — ABNORMAL LOW (ref 12.0–15.0)
MCH: 27.7 pg (ref 26.0–34.0)
MCHC: 32.5 g/dL (ref 30.0–36.0)
MCV: 85.4 fL (ref 80.0–100.0)
Platelets: 183 10*3/uL (ref 150–400)
RBC: 3.64 MIL/uL — ABNORMAL LOW (ref 3.87–5.11)
RDW: 14.2 % (ref 11.5–15.5)
WBC: 14.8 10*3/uL — ABNORMAL HIGH (ref 4.0–10.5)
nRBC: 0 % (ref 0.0–0.2)

## 2022-05-21 LAB — GLUCOSE, CAPILLARY: Glucose-Capillary: 124 mg/dL — ABNORMAL HIGH (ref 70–99)

## 2022-05-21 NOTE — Progress Notes (Signed)
Subjective: Postpartum Day #1: Cesarean Delivery Patient reports incisional pain and tolerating PO.    Objective: Vital signs in last 24 hours: Temp:  [98 F (36.7 C)-99.4 F (37.4 C)] 98.1 F (36.7 C) (10/14 0730) Pulse Rate:  [75-120] 76 (10/14 0730) Resp:  [14-24] 16 (10/14 0730) BP: (97-132)/(61-91) 102/76 (10/14 0730) SpO2:  [95 %-100 %] 100 % (10/14 0730)  Physical Exam:  General: alert Lochia: appropriate Uterine Fundus: firm Incision: dressing C/D/I  Recent Labs    05/20/22 2052 05/21/22 0509  HGB 11.5* 10.1*  HCT 33.8* 31.1*    Assessment/Plan: Status post Cesarean section. Doing well postoperatively.  Continue current care, ambulate, plan for circumcision tomorrow.  Clarene Duke, MD 05/21/2022, 8:47 AM

## 2022-05-21 NOTE — Progress Notes (Signed)
MOB was referred for history of depression/anxiety. * Referral screened out by Clinical Social Worker because none of the following criteria appear to apply: ~ History of anxiety/depression during this pregnancy, or of post-partum depression following prior delivery. ~ Diagnosis of anxiety and/or depression within last 3 years MOB was last diagnosed with anxiety and depression in 2008. No mental health concerns noted in MOB's prenatal care record.   OR * MOB's symptoms currently being treated with medication and/or therapy. Please contact the Clinical Social Worker if needs arise, by [facility] request, or if MOB scores greater than 9/yes to question 10 on Edinburgh Postpartum Depression Screen.  Signed,  Berniece Salines, MSW, LCSWA, [place] 05/21/2022 9:04 AM

## 2022-05-21 NOTE — Lactation Note (Signed)
This note was copied from a baby's chart. Lactation Consultation Note  Patient Name: Kathleen Hendricks Date: 05/21/2022 Reason for consult: Initial assessment;Primapara;1st time breastfeeding;Term;Maternal endocrine disorder Age:43 hours  AMA and GDM 2  LC in to visit with P1 birth parent of term baby delivered by C/S.  Infant breastfed after he was born, but has been sleepy since.  Infant has had 4 formula supplements by bottle due to baby being sleepy.  Reviewed normal newborn feeding behavior in first 24 hrs.  Parents sleepy currently.  Mom will take a nap while baby is sleeping currently.  Reviewed breast massage and hand expression and encouraged Mom to place baby prone STS on her chest after she awakens to encourage feeding cues.    Encouraged calling out for latch assist.  Mom aware of IP and OP lactation support.  Maternal Data Has patient been taught Hand Expression?: Yes Does the patient have breastfeeding experience prior to this delivery?: No  Feeding Mother's Current Feeding Choice: Breast Milk and Formula   Interventions Interventions: Breast feeding basics reviewed;Skin to skin;Breast massage;Hand express;LC Services brochure   Consult Status Consult Status: Follow-up Date: 05/22/22 Follow-up type: In-patient    Broadus John 05/21/2022, 8:29 AM

## 2022-05-21 NOTE — Anesthesia Postprocedure Evaluation (Signed)
Anesthesia Post Note  Patient: SHANDREA LUSK  Procedure(s) Performed: CESAREAN SECTION     Patient location during evaluation: PACU Anesthesia Type: Epidural Level of consciousness: awake and alert Pain management: pain level controlled Vital Signs Assessment: post-procedure vital signs reviewed and stable Respiratory status: spontaneous breathing, nonlabored ventilation and respiratory function stable Cardiovascular status: blood pressure returned to baseline Postop Assessment: epidural receding, no apparent nausea or vomiting, no headache and no backache Anesthetic complications: no   No notable events documented.  Last Vitals:  Vitals:   05/20/22 2301 05/21/22 0333  BP:  97/69  Pulse:  75  Resp: 18 18  Temp: 36.7 C 36.7 C  SpO2: 100% 100%    Last Pain:  Vitals:   05/21/22 0333  TempSrc: Oral  PainSc: Asleep   Pain Goal:                   Marthenia Rolling

## 2022-05-22 ENCOUNTER — Encounter (HOSPITAL_COMMUNITY): Payer: Self-pay | Admitting: Obstetrics and Gynecology

## 2022-05-22 NOTE — Progress Notes (Signed)
POD #2 LTCS Doing ok, more pain than she expected, but able to ambulate Afeb, VSS Abd- soft, fundus firm, incision intact Continue routine care, ambulate

## 2022-05-23 MED ORDER — OXYCODONE HCL 5 MG PO TABS: 5.0000 mg | ORAL_TABLET | ORAL | 0 refills | Status: DC | PRN

## 2022-05-23 NOTE — Discharge Summary (Signed)
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 yo G2P1011 at [redacted]w[redacted]d was admitted to the hospital 05/19/2022 for induction of labor. Patient had a labor course significant for prolonged rupture of membranes and absent cervical change >24 hrs. The patient went for cesarean section due to Arrest of Dilation. Delivery details are as follows: Membrane Rupture Time/Date: 7:52 AM ,05/19/2022   Delivery Method:C-Section, Low Transverse  Details of operation can be found in separate operative Note.  Patient had a postpartum course uncomplicated. She is ambulating, tolerating a regular diet, passing flatus, and urinating well.  Patient is discharged home in stable condition on 05/23/22.      Newborn Data: Birth date:05/20/2022  Birth time:4:36 PM  Gender:Female  Living status:Living  Apgars:9 ,9  Weight:3500 g                                Magnesium Sulfate received: No BMZ received: No Rhophylac:N/A Transfusion:No  Physical exam  Vitals:   05/22/22 0447 05/22/22 1349 05/22/22 2218 05/23/22 0300  BP: 103/62 132/76 118/75 120/80  Pulse: 84 96 91 90  Resp: 16 20 20 18   Temp: 98 F (36.7 C) 97.9 F (36.6 C) 98.2 F (36.8 C) 97.6 F (36.4 C)  TempSrc: Oral Oral Oral Oral  SpO2:  100% 100% 100%  Weight:      Height:       General: alert, cooperative, and no  distress Lochia: appropriate Uterine Fundus: firm Incision: Healing well with no significant drainage, No significant erythema, Dressing is clean, dry, and intact DVT Evaluation: No evidence of DVT seen on physical exam. Labs: Lab Results  Component Value Date   WBC 14.8 (H) 05/21/2022   HGB 10.1 (L) 05/21/2022   HCT 31.1 (L) 05/21/2022   MCV 85.4 05/21/2022   PLT 183 05/21/2022      Latest Ref Rng & Units 05/20/2022    8:52 PM  CMP  Creatinine 0.44 - 1.00 mg/dL 0.70    Edinburgh Score:    05/22/2022    6:11 PM  Edinburgh Postnatal Depression Scale Screening Tool  I have been able to laugh and see the funny side of things. 0  I have looked forward with enjoyment to things. 0  I have blamed myself unnecessarily when things went wrong. 0  I have been anxious or worried for no good reason. 0  I have felt scared or panicky for no good reason. 0  Things have been getting on top of me. 0  I have been so unhappy that I have had difficulty sleeping. 0  I have felt sad or miserable. 0  I have been so unhappy that  I have been crying. 0  The thought of harming myself has occurred to me. 0  Edinburgh Postnatal Depression Scale Total 0      After visit meds:  Allergies as of 05/23/2022   No Known Allergies      Medication List     STOP taking these medications    aspirin EC 81 MG tablet   cyclobenzaprine 10 MG tablet Commonly known as: FLEXERIL   NovoLIN N FlexPen 100 UNIT/ML Kiwkpen Generic drug: Insulin NPH (Human) (Isophane)   NovoLOG FlexPen 100 UNIT/ML FlexPen Generic drug: insulin aspart       TAKE these medications    acetaminophen 500 MG tablet Commonly known as: TYLENOL Take 500 mg by mouth every 6 (six) hours as needed for mild pain.   albuterol 108 (90 Base) MCG/ACT inhaler Commonly known as: VENTOLIN HFA Inhale 2 puffs into the lungs every 6 (six) hours as needed for wheezing or shortness of breath.   oxyCODONE 5 MG immediate release  tablet Commonly known as: Oxy IR/ROXICODONE Take 1-2 tablets (5-10 mg total) by mouth every 4 (four) hours as needed for moderate pain.   prenatal multivitamin Tabs tablet Take 1 tablet by mouth daily at 12 noon.               Discharge Care Instructions  (From admission, onward)           Start     Ordered   05/23/22 0000  Discharge wound care:       Comments: Remove dressing and steri strips 5 days from date of surgery   05/23/22 1038             Discharge home in stable condition Infant Feeding: Bottle and Breast Infant Disposition:home with mother Discharge instruction: per After Visit Summary and Postpartum booklet. Activity: Advance as tolerated. Pelvic rest for 6 weeks.  Diet:  diabetic friendly Anticipated Birth Control: Unsure Postpartum Appointment:4 weeks Additional Postpartum F/U: Postpartum Depression checkup, 2 hour GTT, and Incision check as above Future Appointments:No future appointments. Follow up Visit:  Follow-up Information     Philip Aspen, DO Follow up in 4 week(s).   Specialty: Obstetrics and Gynecology Why: can be see anywhere from 2-4 weeks Contact information: [address] [place] [address] [phone]                     05/23/2022 Philip Aspen, DO

## 2022-05-23 NOTE — Lactation Note (Signed)
This note was copied from a baby's chart. Lactation Consultation Note  Patient Name: Kathleen Hendricks SEGBT'D Date: 05/23/2022 Reason for consult: Follow-up assessment;1st time breastfeeding;Primapara;Term;Maternal endocrine disorder (:C reviewed BF D/C teaching, supply and demand, and resources after D/C.) Age:43 hours  Maternal Data    Feeding Mother's Current Feeding Choice: Breast Milk and Formula Nipple Type: Nfant Slow Flow (purple)  LATCH Score   Lactation Tools Discussed/Used    Interventions    Discharge Discharge Education: Engorgement and breast care;Warning signs for feeding baby Pump: DEBP;Personal  Consult Status Consult Status: Complete Date: 05/23/22    Myer Haff 05/23/2022, 11:32 AM

## 2022-05-24 LAB — SURGICAL PATHOLOGY

## 2022-05-30 ENCOUNTER — Telehealth (HOSPITAL_COMMUNITY): Payer: Self-pay | Admitting: *Deleted

## 2022-05-30 NOTE — Telephone Encounter (Signed)
Attempted hospital discharge follow-up call. Left message for patient to return RN call with any questions or concerns. Erline Levine, RN, 05/30/22, [phone]

## 2022-06-22 ENCOUNTER — Ambulatory Visit (INDEPENDENT_AMBULATORY_CARE_PROVIDER_SITE_OTHER): Payer: BC Managed Care – PPO | Admitting: Certified Nurse Midwife

## 2022-06-22 ENCOUNTER — Encounter: Payer: Self-pay | Admitting: Certified Nurse Midwife

## 2022-06-22 NOTE — Progress Notes (Unsigned)
History:  Ms. Kathleen Hendricks is a 41 y.o. G2P1011 who presents to clinic today on referral from the [place] [employer] lactation program who is seeing her for breastfeeding issues. She received care from [facility] but reported being very unhappy with their postpartum care. On report from the clinic, she had not yet received any postpartum care from [facility].  Pt is 4wks postpartum s/p CS after SROM >24hrs without cervical dilation past 1.5cm.  Gave recall of her pregnancy/delivery and postpartum recovery. Saw [facility] on Friday, 06/17/22 and received a full postpartum evaluation and was cleared physically. Is upset that due to ongoing issues with her supervisor, she does not feel ready to return to work as scheduled and wishes to be written out for an additional several weeks. No physical complaints, incision healing well although she still feels some incisional pain but not severe and does not affect activities of daily living.  The following portions of the patient's history were reviewed and updated as appropriate: allergies, current medications, family history, past medical history, social history, past surgical history and problem list.  Review of Systems:  Pertinent items noted in HPI and remainder of comprehensive ROS otherwise negative.    Objective:  Physical Exam BP 118/77   Pulse 88   Wt 232 lb (105.2 kg)   Breastfeeding Yes   BMI 37.45 kg/m  Physical Exam Vitals and nursing note reviewed.  Constitutional:      Appearance: Normal appearance.  HENT:     Head: Normocephalic and atraumatic.  Eyes:     Pupils: Pupils are equal, round, and reactive to light.  Cardiovascular:     Rate and Rhythm: Normal rate and regular rhythm.  Pulmonary:     Effort: Pulmonary effort is normal.  Musculoskeletal:        General: Normal range of motion.  Skin:    General: Skin is warm and dry.     Capillary Refill: Capillary refill takes less than 2 seconds.  Neurological:     Mental Status: She is  alert and oriented to person, place, and time.  Psychiatric:        Mood and Affect: Mood normal.        Behavior: Behavior normal.        Thought Content: Thought content normal.        Judgment: Judgment normal.    Labs and Imaging No results found for this or any previous visit (from the past 24 hour(s)).  No results found.   Assessment & Plan:  1. Postpartum care and examination - Has a history of insulin dependent GDM, has GTT scheduled at [facility] for 12/8. - Explained that since she has already had a full evaluation at [facility] and no ongoing physical limitations, I cannot write her out of work for additional time without a new physical condition related to pregnancy. Encouraged her to speak with her supervisor/HR.  Can follow up if desires or continue care with [facility] as scheduled.  Bernerd Limbo, CNM 06/22/2022 2:00 PM

## 2022-07-27 ENCOUNTER — Encounter: Payer: Self-pay | Admitting: Internal Medicine

## 2022-07-27 ENCOUNTER — Ambulatory Visit (INDEPENDENT_AMBULATORY_CARE_PROVIDER_SITE_OTHER): Payer: BC Managed Care – PPO | Admitting: Internal Medicine

## 2022-07-27 VITALS — BP 110/70 | HR 81 | Temp 97.8°F | Ht 66.0 in | Wt 240.0 lb

## 2022-07-27 DIAGNOSIS — M5441 Lumbago with sciatica, right side: Secondary | ICD-10-CM

## 2022-07-27 DIAGNOSIS — R7303 Prediabetes: Secondary | ICD-10-CM

## 2022-07-27 DIAGNOSIS — E7800 Pure hypercholesterolemia, unspecified: Secondary | ICD-10-CM | POA: Diagnosis not present

## 2022-07-27 DIAGNOSIS — G8929 Other chronic pain: Secondary | ICD-10-CM

## 2022-07-27 DIAGNOSIS — E538 Deficiency of other specified B group vitamins: Secondary | ICD-10-CM

## 2022-07-27 DIAGNOSIS — Z0001 Encounter for general adult medical examination with abnormal findings: Secondary | ICD-10-CM | POA: Diagnosis not present

## 2022-07-27 DIAGNOSIS — M5442 Lumbago with sciatica, left side: Secondary | ICD-10-CM

## 2022-07-27 DIAGNOSIS — E559 Vitamin D deficiency, unspecified: Secondary | ICD-10-CM | POA: Diagnosis not present

## 2022-07-27 DIAGNOSIS — F32A Depression, unspecified: Secondary | ICD-10-CM

## 2022-07-27 MED ORDER — ALBUTEROL SULFATE HFA 108 (90 BASE) MCG/ACT IN AERS: 2.0000 | INHALATION_SPRAY | Freq: Four times a day (QID) | RESPIRATORY_TRACT | 5 refills | Status: DC | PRN

## 2022-07-27 NOTE — Progress Notes (Signed)
Patient ID: Kathleen Hendricks, female   DOB: [birth year], 41 y.o.   MRN: [id number]         Chief Complaint:: wellness exam and low back pain, pre-DM, hld, depression       HPI:  Kathleen Hendricks is a 41 y.o. female here for wellness exam; declines covid booster, dtap, and follows pap with GYN soon, o/w up to date                        Also had gestational DM, migraines improved.  C/o persistent pain to low mid abd after c section.  Has seen GYN and pt reports they felt she was doing ok.  Stabbing pain, midl to mod, intermittent, with bilateral pain and numbness to back and legs intermittent as well.  No falls.   Denies urinary symptoms such as dysuria, frequency, urgency, flank pain, hematuria or n/v, fever, chills.  Denies worsening reflux, abd pain, dysphagia, n/v, bowel change or blood. Has Gyn f/u planned to recheck sugar.  Pt denies chest pain, increased sob or doe, wheezing, orthopnea, PND, increased LE swelling, palpitations, dizziness or syncope.   Pt denies polydipsia, polyuria, or new focal neuro s/s.      Wt Readings from Last 3 Encounters:  07/27/22 240 lb (108.9 kg)  06/22/22 232 lb (105.2 kg)  05/19/22 260 lb (117.9 kg)   BP Readings from Last 3 Encounters:  07/27/22 110/70  06/22/22 118/77  05/23/22 120/80   Immunization History  Administered Date(s) Administered   Influenza Inj Mdck Quad With Preservative 05/09/2019   Influenza,inj,Quad PF,6+ Mos 04/02/2014, 04/22/2021   Influenza-Unspecified 03/08/2014, 05/11/2015   Health Maintenance Due  Topic Date Due   COVID-19 Vaccine (1) Never done   DTaP/Tdap/Td (1 - Tdap) Never done   PAP SMEAR-Modifier  05/22/2012      Past Medical History:  Diagnosis Date   ANXIETY 03/24/2007   Qualifier: Diagnosis of  By: Jonny Ruiz MD, Len Blalock    Common migraine 03/21/2007   Qualifier: Diagnosis of  By: Maris Berger    Complication of anesthesia    woke up during surgery trying to get off table   DEPRESSION 03/24/2007    Qualifier: Diagnosis of  By: Jonny Ruiz MD, Len Blalock    Gestational diabetes    Headache(784.0)    Obesity (BMI 35.0-39.9 without comorbidity)    Obesity, unspecified 04/02/2014   Other and unspecified hyperlipidemia 04/02/2014   Past Surgical History:  Procedure Laterality Date   CARPAL TUNNEL RELEASE     CESAREAN SECTION N/A 05/20/2022   Procedure: CESAREAN SECTION;  Surgeon: Carlisle Cater, MD;  Location: [facility];  Service: Obstetrics;  Laterality: N/A;   GANGLION CYST EXCISION     left wrist   PILONIDAL CYST EXCISION      reports that she has never smoked. She has never used smokeless tobacco. She reports that she does not drink alcohol and does not use drugs. family history includes Arthritis in her mother; Cancer in her paternal grandmother; Diabetes in her mother; Heart disease in her father and mother; Hyperlipidemia in her father and mother; Hypertension in her father and mother. No Known Allergies Current Outpatient Medications on File Prior to Visit  Medication Sig Dispense Refill   acetaminophen (TYLENOL) 500 MG tablet Take 500 mg by mouth every 6 (six) hours as needed for mild pain.     Prenatal Vit-Fe Fumarate-FA (PRENATAL MULTIVITAMIN) TABS tablet Take 1 tablet by mouth daily  at 12 noon.     No current facility-administered medications on file prior to visit.        ROS:  All others reviewed and negative.  Objective        PE:  BP 110/70   Pulse 81   Temp 97.8 F (36.6 C) (Oral)   Ht 5\' 6"  (1.676 m)   Wt 240 lb (108.9 kg)   SpO2 96%   BMI 38.74 kg/m                 Constitutional: Pt appears in NAD               HENT: Head: NCAT.                Right Ear: External ear normal.                 Left Ear: External ear normal.                Eyes: . Pupils are equal, round, and reactive to light. Conjunctivae and EOM are normal               Nose: without d/c or deformity               Neck: Neck supple. Gross normal ROM               Cardiovascular: Normal rate  and regular rhythm.                 Pulmonary/Chest: Effort normal and breath sounds without rales or wheezing.                Abd:  Soft, NT, ND, + BS, no organomegaly               LS spine with midl to mod tender at midline without swelling, rash approx L4               Neurological: Pt is alert. At baseline orientation, motor grossly intact               Skin: Skin is warm. No rashes, no other new lesions, LE edema - none               Psychiatric: Pt behavior is normal without agitation   Micro: none  Cardiac tracings I have personally interpreted today:  none  Pertinent Radiological findings (summarize): none   Lab Results  Component Value Date   WBC 14.8 (H) 05/21/2022   HGB 10.1 (L) 05/21/2022   HCT 31.1 (L) 05/21/2022   PLT 183 05/21/2022   GLUCOSE 139 (H) 03/09/2022   CHOL 191 08/31/2021   TRIG 155.0 (H) 08/31/2021   HDL 37.30 (L) 08/31/2021   LDLCALC 122 (H) 08/31/2021   ALT 16 03/09/2022   AST 22 03/09/2022   NA 136 03/09/2022   K 3.5 03/09/2022   CL 107 03/09/2022   CREATININE 0.70 05/20/2022   BUN <5 (L) 03/09/2022   CO2 22 03/09/2022   TSH 2.78 08/31/2021   HGBA1C 5.7 (H) 05/19/2022   Assessment/Plan:  Kathleen Hendricks is a 41 y.o. Black or African American [2] female with  has a past medical history of ANXIETY (03/24/2007), Common migraine (03/21/2007), Complication of anesthesia, DEPRESSION (03/24/2007), Gestational diabetes, Headache(784.0), Obesity (BMI 35.0-39.9 without comorbidity), Obesity, unspecified (04/02/2014), and Other and unspecified hyperlipidemia (04/02/2014).  Encounter for well adult exam with abnormal findings Age and sex appropriate education and counseling updated with  regular exercise and diet Referrals for preventative services - none needed Immunizations addressed - declines covid booster, dtap Smoking counseling  - none needed Evidence for depression or other mood disorder - stable depression, declines need for med tx or  referral Most recent labs reviewed. I have personally reviewed and have noted: 1) the patient's medical and social history 2) The patient's current medications and supplements 3) The patient's height, weight, and BMI have been recorded in the chart   Depression Stable, declines need for change in med tx or referral  HLD (hyperlipidemia) Lab Results  Component Value Date   LDLCALC 122 (H) 08/31/2021   Unocntrolled, for lower chol diet, declines statin  Pre-diabetes Lab Results  Component Value Date   HGBA1C 5.7 (H) 05/19/2022   Stable, pt to continue current medical treatment  - diet, wt control   LOW BACK PAIN At least 3 mo persistent lower back pain unexplained,  despite parturition x 2 mo; hold on gabapentin as currently breast feeding, for MRI LS spine  Followup: Return in about 6 months (around 01/26/2023).  Oliver Barre, MD 07/27/2022 9:00 PM [facility]

## 2022-07-27 NOTE — Assessment & Plan Note (Signed)
At least 3 mo persistent lower back pain unexplained,  despite parturition x 2 mo; hold on gabapentin as currently breast feeding, for MRI LS spine

## 2022-07-27 NOTE — Assessment & Plan Note (Signed)
Lab Results  Component Value Date   LDLCALC 122 (H) 08/31/2021   Unocntrolled, for lower chol diet, declines statin

## 2022-07-27 NOTE — Assessment & Plan Note (Signed)
Lab Results  Component Value Date   HGBA1C 5.7 (H) 05/19/2022   Stable, pt to continue current medical treatment  - diet, wt control

## 2022-07-27 NOTE — Patient Instructions (Signed)
Please continue all other medications as before, and refills have been done if requested - the inhaler  Please have the pharmacy call with any other refills you may need.  Please continue your efforts at being more active, low cholesterol diet, and weight control.  You are otherwise up to date with prevention measures today.  Please keep your appointments with your specialists as you may have planned  You will be contacted regarding the referral for: MRI for the lower back  Please go to the LAB at the blood drawing area for the tests to be done  You will be contacted by phone if any changes need to be made immediately.  Otherwise, you will receive a letter about your results with an explanation, but please check with MyChart first.  Please remember to sign up for MyChart if you have not done so, as this will be important to you in the future with finding out test results, communicating by private email, and scheduling acute appointments online when needed.  Please make an Appointment to return in 6 months, or sooner if needed

## 2022-07-27 NOTE — Assessment & Plan Note (Signed)
Age and sex appropriate education and counseling updated with regular exercise and diet Referrals for preventative services - none needed Immunizations addressed - declines covid booster, dtap Smoking counseling  - none needed Evidence for depression or other mood disorder - stable depression, declines need for med tx or referral Most recent labs reviewed. I have personally reviewed and have noted: 1) the patient's medical and social history 2) The patient's current medications and supplements 3) The patient's height, weight, and BMI have been recorded in the chart

## 2022-07-27 NOTE — Assessment & Plan Note (Signed)
Stable, declines need for change in med tx or referral

## 2022-10-02 ENCOUNTER — Ambulatory Visit
Admission: RE | Admit: 2022-10-02 | Discharge: 2022-10-02 | Disposition: A | Discharge status: Home / Self Care | Payer: BC Managed Care – PPO | Source: Ambulatory Visit | Attending: Internal Medicine | Admitting: Internal Medicine

## 2022-10-02 DIAGNOSIS — G8929 Other chronic pain: Secondary | ICD-10-CM

## 2022-10-04 ENCOUNTER — Other Ambulatory Visit: Payer: Self-pay | Admitting: Internal Medicine

## 2022-10-04 DIAGNOSIS — G8929 Other chronic pain: Secondary | ICD-10-CM

## 2023-07-03 ENCOUNTER — Ambulatory Visit: Payer: BC Managed Care – PPO | Admitting: Internal Medicine

## 2023-07-04 ENCOUNTER — Ambulatory Visit: Payer: BC Managed Care – PPO | Admitting: Internal Medicine

## 2023-07-12 ENCOUNTER — Ambulatory Visit: Payer: BC Managed Care – PPO | Admitting: Internal Medicine

## 2023-07-24 ENCOUNTER — Encounter: Payer: Self-pay | Admitting: Internal Medicine

## 2023-07-24 ENCOUNTER — Ambulatory Visit: Payer: BC Managed Care – PPO | Admitting: Internal Medicine

## 2023-07-24 VITALS — BP 124/78 | HR 88 | Temp 98.1°F | Ht 66.0 in | Wt 251.0 lb

## 2023-07-24 DIAGNOSIS — J309 Allergic rhinitis, unspecified: Secondary | ICD-10-CM | POA: Diagnosis not present

## 2023-07-24 DIAGNOSIS — E538 Deficiency of other specified B group vitamins: Secondary | ICD-10-CM | POA: Diagnosis not present

## 2023-07-24 DIAGNOSIS — Z0000 Encounter for general adult medical examination without abnormal findings: Secondary | ICD-10-CM | POA: Diagnosis not present

## 2023-07-24 DIAGNOSIS — Z7985 Long-term (current) use of injectable non-insulin antidiabetic drugs: Secondary | ICD-10-CM | POA: Diagnosis not present

## 2023-07-24 DIAGNOSIS — L720 Epidermal cyst: Secondary | ICD-10-CM | POA: Insufficient documentation

## 2023-07-24 DIAGNOSIS — Z0001 Encounter for general adult medical examination with abnormal findings: Secondary | ICD-10-CM

## 2023-07-24 DIAGNOSIS — E559 Vitamin D deficiency, unspecified: Secondary | ICD-10-CM

## 2023-07-24 DIAGNOSIS — R7303 Prediabetes: Secondary | ICD-10-CM

## 2023-07-24 DIAGNOSIS — Z23 Encounter for immunization: Secondary | ICD-10-CM | POA: Diagnosis not present

## 2023-07-24 DIAGNOSIS — E1165 Type 2 diabetes mellitus with hyperglycemia: Secondary | ICD-10-CM

## 2023-07-24 DIAGNOSIS — R739 Hyperglycemia, unspecified: Secondary | ICD-10-CM | POA: Insufficient documentation

## 2023-07-24 DIAGNOSIS — F32A Depression, unspecified: Secondary | ICD-10-CM

## 2023-07-24 DIAGNOSIS — B354 Tinea corporis: Secondary | ICD-10-CM | POA: Insufficient documentation

## 2023-07-24 DIAGNOSIS — L689 Hypertrichosis, unspecified: Secondary | ICD-10-CM | POA: Insufficient documentation

## 2023-07-24 DIAGNOSIS — F411 Generalized anxiety disorder: Secondary | ICD-10-CM

## 2023-07-24 DIAGNOSIS — E7800 Pure hypercholesterolemia, unspecified: Secondary | ICD-10-CM | POA: Diagnosis not present

## 2023-07-24 DIAGNOSIS — O24419 Gestational diabetes mellitus in pregnancy, unspecified control: Secondary | ICD-10-CM | POA: Insufficient documentation

## 2023-07-24 LAB — CBC WITH DIFFERENTIAL/PLATELET
Basophils Absolute: 0 10*3/uL (ref 0.0–0.1)
Basophils Relative: 0.5 % (ref 0.0–3.0)
Eosinophils Absolute: 0.1 10*3/uL (ref 0.0–0.7)
Eosinophils Relative: 2.1 % (ref 0.0–5.0)
HCT: 40.7 % (ref 36.0–46.0)
Hemoglobin: 13.4 g/dL (ref 12.0–15.0)
Lymphocytes Relative: 29.9 % (ref 12.0–46.0)
Lymphs Abs: 2.2 10*3/uL (ref 0.7–4.0)
MCHC: 32.8 g/dL (ref 30.0–36.0)
MCV: 85.1 fL (ref 78.0–100.0)
Monocytes Absolute: 0.6 10*3/uL (ref 0.1–1.0)
Monocytes Relative: 8.4 % (ref 3.0–12.0)
Neutro Abs: 4.3 10*3/uL (ref 1.4–7.7)
Neutrophils Relative %: 59.1 % (ref 43.0–77.0)
Platelets: 328 10*3/uL (ref 150.0–400.0)
RBC: 4.79 Mil/uL (ref 3.87–5.11)
RDW: 13.8 % (ref 11.5–15.5)
WBC: 7.2 10*3/uL (ref 4.0–10.5)

## 2023-07-24 LAB — BASIC METABOLIC PANEL
BUN: 8 mg/dL (ref 6–23)
CO2: 26 meq/L (ref 19–32)
Calcium: 9.3 mg/dL (ref 8.4–10.5)
Chloride: 105 meq/L (ref 96–112)
Creatinine, Ser: 0.7 mg/dL (ref 0.40–1.20)
GFR: 104.91 mL/min (ref >60.00)
Glucose, Bld: 94 mg/dL (ref 70–99)
Potassium: 3.9 meq/L (ref 3.5–5.1)
Sodium: 138 meq/L (ref 135–145)

## 2023-07-24 LAB — HEPATIC FUNCTION PANEL
ALT: 36 U/L — ABNORMAL HIGH (ref 0–35)
AST: 27 U/L (ref 0–37)
Albumin: 4.2 g/dL (ref 3.5–5.2)
Alkaline Phosphatase: 80 U/L (ref 39–117)
Bilirubin, Direct: 0.1 mg/dL (ref 0.0–0.3)
Total Bilirubin: 0.3 mg/dL (ref 0.2–1.2)
Total Protein: 7.7 g/dL (ref 6.0–8.3)

## 2023-07-24 LAB — VITAMIN B12: Vitamin B-12: 244 pg/mL (ref 211–911)

## 2023-07-24 LAB — MICROALBUMIN / CREATININE URINE RATIO
Creatinine,U: 165.5 mg/dL
Microalb Creat Ratio: 1.4 mg/g (ref 0.0–30.0)
Microalb, Ur: 2.3 mg/dL — ABNORMAL HIGH (ref 0.0–1.9)

## 2023-07-24 LAB — HEMOGLOBIN A1C: Hgb A1c MFr Bld: 6.6 % — ABNORMAL HIGH (ref 4.6–6.5)

## 2023-07-24 LAB — LIPID PANEL
Cholesterol: 162 mg/dL (ref 0–200)
HDL: 41.5 mg/dL (ref >39.00)
LDL Cholesterol: 108 mg/dL — ABNORMAL HIGH (ref 0–99)
NonHDL: 120.69
Total CHOL/HDL Ratio: 4
Triglycerides: 64 mg/dL (ref 0.0–149.0)
VLDL: 12.8 mg/dL (ref 0.0–40.0)

## 2023-07-24 LAB — TSH: TSH: 3.17 u[IU]/mL (ref 0.35–5.50)

## 2023-07-24 LAB — VITAMIN D 25 HYDROXY (VIT D DEFICIENCY, FRACTURES): VITD: 14.49 ng/mL — ABNORMAL LOW (ref 30.00–100.00)

## 2023-07-24 MED ORDER — ALPRAZOLAM 0.5 MG PO TABS: 0.5000 mg | ORAL_TABLET | Freq: Two times a day (BID) | ORAL | 1 refills | Status: AC | PRN

## 2023-07-24 MED ORDER — PREDNISONE 10 MG PO TABS
ORAL_TABLET | ORAL | 0 refills | Status: DC
Start: 2023-07-24 — End: 2024-04-05

## 2023-07-24 NOTE — Assessment & Plan Note (Addendum)
Lab Results  Component Value Date   HGBA1C 6.6 (H) 07/24/2023   New onset DM, pt to start mounjaro 2.5 mg weekly

## 2023-07-24 NOTE — Assessment & Plan Note (Signed)
Mild to mod, for prednisone taper, also for allegra and nasaocrt asd, to f/u any worsening symptoms or concerns

## 2023-07-24 NOTE — Assessment & Plan Note (Signed)
Lab Results  Component Value Date   LDLCALC 122 (H) 08/31/2021   uncontrolled, for low chol diet, declines statin for now, for f/u lab

## 2023-07-24 NOTE — Progress Notes (Deleted)
Chief Complaint: follow up HTN, HLD and hyperglycemia ***       HPI:  Kathleen Hendricks is a 42 y.o. female here with c/o        Wt Readings from Last 3 Encounters:  07/24/23 251 lb (113.9 kg)  07/27/22 240 lb (108.9 kg)  06/22/22 232 lb (105.2 kg)   BP Readings from Last 3 Encounters:  07/24/23 124/78  07/27/22 110/70  06/22/22 118/77         Past Medical History:  Diagnosis Date   ANXIETY 03/24/2007   Qualifier: Diagnosis of  By: Jonny Ruiz MD, Len Blalock    Common migraine 03/21/2007   Qualifier: Diagnosis of  By: Maris Berger    Complication of anesthesia    woke up during surgery trying to get off table   DEPRESSION 03/24/2007   Qualifier: Diagnosis of  By: Jonny Ruiz MD, Len Blalock    Gestational diabetes    Headache(784.0)    Obesity (BMI 35.0-39.9 without comorbidity)    Obesity, unspecified 04/02/2014   Other and unspecified hyperlipidemia 04/02/2014   Past Surgical History:  Procedure Laterality Date   CARPAL TUNNEL RELEASE     CESAREAN SECTION N/A 05/20/2022   Procedure: CESAREAN SECTION;  Surgeon: Carlisle Cater, MD;  Location: [facility] LD ORS;  Service: Obstetrics;  Laterality: N/A;   GANGLION CYST EXCISION     left wrist   PILONIDAL CYST EXCISION      reports that she has never smoked. She has never used smokeless tobacco. She reports that she does not drink alcohol and does not use drugs. family history includes Arthritis in her mother; Cancer in her paternal grandmother; Diabetes in her mother; Heart disease in her father and mother; Hyperlipidemia in her father and mother; Hypertension in her father and mother. No Known Allergies Current Outpatient Medications on File Prior to Visit  Medication Sig Dispense Refill   acetaminophen (TYLENOL) 500 MG tablet Take 500 mg by mouth every 6 (six) hours as needed for mild pain. (Patient not taking: Reported on 07/24/2023)     albuterol (VENTOLIN HFA) 108 (90 Base) MCG/ACT inhaler Inhale 2 puffs into the lungs every 6  (six) hours as needed for wheezing or shortness of breath. (Patient not taking: Reported on 07/24/2023) 8 g 5   Prenatal Vit-Fe Fumarate-FA (PRENATAL MULTIVITAMIN) TABS tablet Take 1 tablet by mouth daily at 12 noon. (Patient not taking: Reported on 07/24/2023)     No current facility-administered medications on file prior to visit.        ROS:  All others reviewed and negative.  Objective        PE:  BP 124/78 (BP Location: Left Arm, Patient Position: Sitting, Cuff Size: Normal)   Pulse 88   Temp 98.1 F (36.7 C) (Oral)   Ht 5\' 6"  (1.676 m)   Wt 251 lb (113.9 kg)   LMP 07/04/2023 (Approximate)   SpO2 99%   Breastfeeding No   BMI 40.51 kg/m                 Constitutional: Pt appears in NAD               HENT: Head: NCAT.                Right Ear: External ear normal.                 Left Ear: External ear normal.  Eyes: . Pupils are equal, round, and reactive to light. Conjunctivae and EOM are normal               Nose: without d/c or deformity               Neck: Neck supple. Gross normal ROM               Cardiovascular: Normal rate and regular rhythm.                 Pulmonary/Chest: Effort normal and breath sounds without rales or wheezing.                Abd:  Soft, NT, ND, + BS, no organomegaly               Neurological: Pt is alert. At baseline orientation, motor grossly intact               Skin: Skin is warm. No rashes, no other new lesions, LE edema - ***               Psychiatric: Pt behavior is normal without agitation   Micro: none  Cardiac tracings I have personally interpreted today:  none  Pertinent Radiological findings (summarize): none   Lab Results  Component Value Date   WBC 14.8 (H) 05/21/2022   HGB 10.1 (L) 05/21/2022   HCT 31.1 (L) 05/21/2022   PLT 183 05/21/2022   GLUCOSE 139 (H) 03/09/2022   CHOL 191 08/31/2021   TRIG 155.0 (H) 08/31/2021   HDL 37.30 (L) 08/31/2021   LDLCALC 122 (H) 08/31/2021   ALT 16 03/09/2022   AST 22  03/09/2022   NA 136 03/09/2022   K 3.5 03/09/2022   CL 107 03/09/2022   CREATININE 0.70 05/20/2022   BUN <5 (L) 03/09/2022   CO2 22 03/09/2022   TSH 2.78 08/31/2021   HGBA1C 5.7 (H) 05/19/2022   Assessment/Plan:  Kathleen Hendricks is a 42 y.o. Black or African American [2] female with  has a past medical history of ANXIETY (03/24/2007), Common migraine (03/21/2007), Complication of anesthesia, DEPRESSION (03/24/2007), Gestational diabetes, Headache(784.0), Obesity (BMI 35.0-39.9 without comorbidity), Obesity, unspecified (04/02/2014), and Other and unspecified hyperlipidemia (04/02/2014).  No problem-specific Assessment & Plan notes found for this encounter.  Followup: No follow-ups on file.  Oliver Barre, MD 07/24/2023 2:28 PM [facility] [place] - [facility]

## 2023-07-24 NOTE — Assessment & Plan Note (Signed)
Uncontrolled, for xanax prn

## 2023-07-24 NOTE — Assessment & Plan Note (Addendum)
Age and sex appropriate education and counseling updated with regular exercise and diet Referrals for preventative services - pt to call for pap soon Immunizations addressed - declines covid booster, for flu shot today Smoking counseling  - none needed Evidence for depression or other mood disorder - none significant Most recent labs reviewed. I have personally reviewed and have noted: 1) the patient's medical and social history 2) The patient's current medications and supplements 3) The patient's height, weight, and BMI have been recorded in the chart

## 2023-07-24 NOTE — Assessment & Plan Note (Signed)
Stable overall, declines need for counseling referral

## 2023-07-24 NOTE — Progress Notes (Addendum)
Patient ID: Kathleen Hendricks, female   DOB: 1980-08-11, 42 y.o.   MRN: [id number]         Chief Complaint:: wellness exam and Annual Exam (Cough started on Friday and right ear pain started last night )  , anxiety, hyperglycemia, hld, depression       HPI:  Kathleen Hendricks is a 42 y.o. female here for wellness exam; declines covid booster, plans to see gyn soon for pap, o/w up to date                        Also father died last wk, grieving, but Denies worsening depressive symptoms, suicidal ideation, or panic; Does have several wks ongoing nasal allergy symptoms with clearish congestion, itch and sneezing, without fever, pain, ST, cough, swelling or wheezing.  Pt denies chest pain, increased sob or doe, wheezing, orthopnea, PND, increased LE swelling, palpitations, dizziness or syncope.   Pt denies polydipsia, polyuria, or new focal neuro s/s.    Pt denies fever, wt loss, night sweats, loss of appetite, or other constitutional symptoms    Wt Readings from Last 3 Encounters:  07/24/23 251 lb (113.9 kg)  07/27/22 240 lb (108.9 kg)  06/22/22 232 lb (105.2 kg)   BP Readings from Last 3 Encounters:  07/24/23 124/78  07/27/22 110/70  06/22/22 118/77   Immunization History  Administered Date(s) Administered   Influenza Inj Mdck Quad Pf 04/15/2022   Influenza Inj Mdck Quad With Preservative 05/09/2019   Influenza, Seasonal, Injecte, Preservative Fre 07/24/2023   Influenza,inj,Quad PF,6+ Mos 04/02/2014, 04/22/2021   Influenza-Unspecified 03/08/2014, 05/11/2015, 04/08/2021   Tdap 03/04/2022   Health Maintenance Due  Topic Date Due   FOOT EXAM  Never done   OPHTHALMOLOGY EXAM  Never done   Cervical Cancer Screening (HPV/Pap Cotest)  05/22/2014      Past Medical History:  Diagnosis Date   ANXIETY 03/24/2007   Qualifier: Diagnosis of  By: Jonny Ruiz MD, Len Blalock    Common migraine 03/21/2007   Qualifier: Diagnosis of  By: Maris Berger    Complication of anesthesia    woke up  during surgery trying to get off table   DEPRESSION 03/24/2007   Qualifier: Diagnosis of  By: Jonny Ruiz MD, Len Blalock    Gestational diabetes    Headache(784.0)    Obesity (BMI 35.0-39.9 without comorbidity)    Obesity, unspecified 04/02/2014   Other and unspecified hyperlipidemia 04/02/2014   Past Surgical History:  Procedure Laterality Date   CARPAL TUNNEL RELEASE     CESAREAN SECTION N/A 05/20/2022   Procedure: CESAREAN SECTION;  Surgeon: Carlisle Cater, MD;  Location: [facility];  Service: Obstetrics;  Laterality: N/A;   GANGLION CYST EXCISION     left wrist   PILONIDAL CYST EXCISION      reports that she has never smoked. She has never used smokeless tobacco. She reports that she does not drink alcohol and does not use drugs. family history includes Arthritis in her mother; Cancer in her paternal grandmother; Diabetes in her mother; Heart disease in her father and mother; Hyperlipidemia in her father and mother; Hypertension in her father and mother. No Known Allergies Current Outpatient Medications on File Prior to Visit  Medication Sig Dispense Refill   acetaminophen (TYLENOL) 500 MG tablet Take 500 mg by mouth every 6 (six) hours as needed for mild pain. (Patient not taking: Reported on 07/24/2023)     albuterol (VENTOLIN HFA) 108 (90 Base)  MCG/ACT inhaler Inhale 2 puffs into the lungs every 6 (six) hours as needed for wheezing or shortness of breath. (Patient not taking: Reported on 07/24/2023) 8 g 5   Prenatal Vit-Fe Fumarate-FA (PRENATAL MULTIVITAMIN) TABS tablet Take 1 tablet by mouth daily at 12 noon. (Patient not taking: Reported on 07/24/2023)     No current facility-administered medications on file prior to visit.        ROS:  All others reviewed and negative.  Objective        PE:  BP 124/78 (BP Location: Left Arm, Patient Position: Sitting, Cuff Size: Normal)   Pulse 88   Temp 98.1 F (36.7 C) (Oral)   Ht 5\' 6"  (1.676 m)   Wt 251 lb (113.9 kg)   LMP 07/04/2023  (Approximate)   SpO2 99%   Breastfeeding No   BMI 40.51 kg/m                 Constitutional: Pt appears in NAD               HENT: Head: NCAT.                Right Ear: External ear normal.                 Left Ear: External ear normal.                Eyes: . Pupils are equal, round, and reactive to light. Conjunctivae and EOM are normal               Nose: without d/c or deformity               Neck: Neck supple. Gross normal ROM               Cardiovascular: Normal rate and regular rhythm.                 Pulmonary/Chest: Effort normal and breath sounds without rales or wheezing.                Abd:  Soft, NT, ND, + BS, no organomegaly               Neurological: Pt is alert. At baseline orientation, motor grossly intact               Skin: Skin is warm. No rashes, no other new lesions, LE edema - none               Psychiatric: Pt behavior is normal without agitation   Micro: none  Cardiac tracings I have personally interpreted today:  none  Pertinent Radiological findings (summarize): none   Lab Results  Component Value Date   WBC 7.2 07/24/2023   HGB 13.4 07/24/2023   HCT 40.7 07/24/2023   PLT 328.0 07/24/2023   GLUCOSE 94 07/24/2023   CHOL 162 07/24/2023   TRIG 64.0 07/24/2023   HDL 41.50 07/24/2023   LDLCALC 108 (H) 07/24/2023   ALT 36 (H) 07/24/2023   AST 27 07/24/2023   NA 138 07/24/2023   K 3.9 07/24/2023   CL 105 07/24/2023   CREATININE 0.70 07/24/2023   BUN 8 07/24/2023   CO2 26 07/24/2023   TSH 3.17 07/24/2023   HGBA1C 6.6 (H) 07/24/2023   MICROALBUR 2.3 (H) 07/24/2023   Assessment/Plan:  Kathleen Hendricks is a 42 y.o. Black or African American [2] female with  has a past medical history of ANXIETY (03/24/2007), Common migraine (  03/21/2007), Complication of anesthesia, DEPRESSION (03/24/2007), Gestational diabetes, Headache(784.0), Obesity (BMI 35.0-39.9 without comorbidity), Obesity, unspecified (04/02/2014), and Other and unspecified hyperlipidemia  (04/02/2014).  Encounter for well adult exam with abnormal findings Age and sex appropriate education and counseling updated with regular exercise and diet Referrals for preventative services - pt to call for pap soon Immunizations addressed - declines covid booster, for flu shot today Smoking counseling  - none needed Evidence for depression or other mood disorder - none significant Most recent labs reviewed. I have personally reviewed and have noted: 1) the patient's medical and social history 2) The patient's current medications and supplements 3) The patient's height, weight, and BMI have been recorded in the chart   Diabetes [facility]) Lab Results  Component Value Date   HGBA1C 6.6 (H) 07/24/2023   New onset DM, pt to start mounjaro 2.5 mg weekly   HLD (hyperlipidemia) Lab Results  Component Value Date   LDLCALC 122 (H) 08/31/2021   uncontrolled, for low chol diet, declines statin for now, for f/u lab   Depression Stable overall, declines need for counseling referral  ANXIETY Uncontrolled, for xanax prn  Allergic rhinitis Mild to mod, for prednisone taper, also for allegra and nasaocrt asd, to f/u any worsening symptoms or concerns  Followup: Return in about 1 year (around 07/23/2024).  Oliver Barre, MD 07/25/2023 8:15 AM [facility] [place] [facility] - [facility]

## 2023-07-24 NOTE — Patient Instructions (Addendum)
Please take all new medication as prescribed - the xanax as needed  You had the flu shot today  Please continue all other medications as before, and refills have been done if requested.  Please have the pharmacy call with any other refills you may need.  Please continue your efforts at being more active, low cholesterol diet, and weight control.  You are otherwise up to date with prevention measures today.  Please keep your appointments with your specialists as you may have planned  Please go to the LAB at the blood drawing area for the tests to be done  You will be contacted by phone if any changes need to be made immediately.  Otherwise, you will receive a letter about your results with an explanation, but please check with MyChart first.  Please make an Appointment to return for your 1 year visit, or sooner if needed

## 2023-07-25 ENCOUNTER — Other Ambulatory Visit: Payer: Self-pay | Admitting: Internal Medicine

## 2023-07-25 ENCOUNTER — Telehealth: Payer: Self-pay | Admitting: Internal Medicine

## 2023-07-25 LAB — URINALYSIS, ROUTINE W REFLEX MICROSCOPIC
Bilirubin Urine: NEGATIVE
Ketones, ur: NEGATIVE
Leukocytes,Ua: NEGATIVE
Nitrite: NEGATIVE
Specific Gravity, Urine: 1.025 (ref 1.000–1.030)
Total Protein, Urine: NEGATIVE
Urine Glucose: NEGATIVE
Urobilinogen, UA: 0.2 (ref 0.0–1.0)
pH: 6 (ref 5.0–8.0)

## 2023-07-25 MED ORDER — TIRZEPATIDE 2.5 MG/0.5ML ~~LOC~~ SOAJ
2.5000 mg | SUBCUTANEOUS | 11 refills | Status: DC
Start: 2023-07-25 — End: 2023-10-09

## 2023-07-25 NOTE — Telephone Encounter (Signed)
Pt viewed Mychart message from Dr.John about lab results at 12:06pm.

## 2023-07-25 NOTE — Telephone Encounter (Signed)
Pt called wanting to go over her test results. Please  advise once Dr Jonny Ruiz go over the test results.

## 2023-07-25 NOTE — Addendum Note (Signed)
Addended by: Corwin Levins on: 07/25/2023 08:15 AM   Modules accepted: Level of Service

## 2023-07-26 ENCOUNTER — Encounter: Payer: Self-pay | Admitting: Internal Medicine

## 2023-07-26 MED ORDER — PROMETHAZINE-DM 6.25-15 MG/5ML PO SYRP: 5.0000 mL | ORAL_SOLUTION | Freq: Four times a day (QID) | ORAL | 0 refills | Status: DC | PRN

## 2023-07-26 NOTE — Telephone Encounter (Signed)
 Copied from CRM [id number]. Topic: Clinical - Lab/Test Results >> Jul 26, 2023  3:42 PM Lennart Pall wrote: Reason for CRM: PT sent in message for the Dr to call her to go over lab results and she has not heard back, this is the 2nd time calling.

## 2023-08-11 ENCOUNTER — Telehealth: Payer: Self-pay

## 2023-08-11 ENCOUNTER — Other Ambulatory Visit (HOSPITAL_COMMUNITY): Payer: Self-pay

## 2023-08-11 NOTE — Telephone Encounter (Signed)
 Copied from CRM [id number]. Topic: General - Other >> Aug 11, 2023 10:20 AM Denese Killings wrote: Reason for CRM: Patient needs a Prior Authorization sent over for [facility] and needs to go to [employer] on [address].

## 2023-08-11 NOTE — Telephone Encounter (Signed)
 Pharmacy Patient Advocate Encounter   Received notification from Pt Calls Messages that prior authorization for Mounjaro  2.5MG /0.5ML auto-injectors is required/requested.   Insurance verification completed.   The patient is insured through [facility] .   Per test claim: PA required; PA submitted to above mentioned insurance via CoverMyMeds Key/confirmation #/EOC [id number] Status is pending

## 2023-08-11 NOTE — Telephone Encounter (Signed)
 PA request has been Submitted. New Encounter created for follow up. For additional info see Pharmacy Prior Auth telephone encounter from 08/11/23.

## 2023-08-14 NOTE — Telephone Encounter (Signed)
 Pharmacy Patient Advocate Encounter  Received notification from [facility] that Prior Authorization for [facility] 2.5mg  has been APPROVED from 08/11/2023 to 08/10/2026

## 2023-10-06 ENCOUNTER — Other Ambulatory Visit: Payer: Self-pay | Admitting: Internal Medicine

## 2023-10-09 ENCOUNTER — Other Ambulatory Visit: Payer: Self-pay

## 2023-10-30 ENCOUNTER — Ambulatory Visit (INDEPENDENT_AMBULATORY_CARE_PROVIDER_SITE_OTHER): Admitting: Internal Medicine

## 2023-10-30 ENCOUNTER — Encounter: Payer: Self-pay | Admitting: Internal Medicine

## 2023-10-30 VITALS — BP 122/80 | HR 90 | Temp 98.4°F | Ht 66.0 in | Wt 245.0 lb

## 2023-10-30 DIAGNOSIS — M545 Low back pain, unspecified: Secondary | ICD-10-CM

## 2023-10-30 DIAGNOSIS — E559 Vitamin D deficiency, unspecified: Secondary | ICD-10-CM | POA: Insufficient documentation

## 2023-10-30 DIAGNOSIS — E1165 Type 2 diabetes mellitus with hyperglycemia: Secondary | ICD-10-CM

## 2023-10-30 DIAGNOSIS — E7800 Pure hypercholesterolemia, unspecified: Secondary | ICD-10-CM | POA: Diagnosis not present

## 2023-10-30 MED ORDER — TIRZEPATIDE 5 MG/0.5ML ~~LOC~~ SOAJ: 5.0000 mg | SUBCUTANEOUS | 3 refills | Status: DC

## 2023-10-30 MED ORDER — KETOROLAC TROMETHAMINE 30 MG/ML IJ SOLN
30.0000 mg | Freq: Once | INTRAMUSCULAR | Status: AC
  Administered 2023-10-30: 30 mg via INTRAMUSCULAR

## 2023-10-30 MED ORDER — LEVOFLOXACIN 500 MG PO TABS: 500.0000 mg | ORAL_TABLET | Freq: Every day | ORAL | 0 refills | Status: DC

## 2023-10-30 MED ORDER — CYCLOBENZAPRINE HCL 5 MG PO TABS: 5.0000 mg | ORAL_TABLET | Freq: Three times a day (TID) | ORAL | 1 refills | Status: AC | PRN

## 2023-10-30 MED ORDER — HYDROCODONE-ACETAMINOPHEN 10-325 MG PO TABS: 1.0000 | ORAL_TABLET | Freq: Three times a day (TID) | ORAL | 0 refills | Status: DC | PRN

## 2023-10-30 NOTE — Assessment & Plan Note (Signed)
 Lab Results  Component Value Date   HGBA1C 6.6 (H) 07/24/2023   With uncontrolled obesity, pt for increased mounjaro 5 mg weekly

## 2023-10-30 NOTE — Assessment & Plan Note (Signed)
 Last vitamin D Lab Results  Component Value Date   VD25OH 14.49 (L) 07/24/2023   Low, to start oral replacement

## 2023-10-30 NOTE — Assessment & Plan Note (Addendum)
 C/ msk strain - for flexeril 5 tid prn, also toradol 30 mg IM, for UA

## 2023-10-30 NOTE — Patient Instructions (Signed)
 You had the pain shot today (toradol 30 mg )  Please take all new medication as prescribed - the muscle relaxer as needed  Ok to increase the mounjaro to 5 mg weekly, and call in 1 month if you need the higher dose  Please continue all other medications as before, and refills have been done if requested.  Please have the pharmacy call with any other refills you may need.  Please keep your appointments with your specialists as you may have planned  Please go to the LAB at the blood drawing area for the tests to be done-  just the urine testing today  You will be contacted by phone if any changes need to be made immediately.  Otherwise, you will receive a letter about your results with an explanation, but please check with MyChart first.

## 2023-10-30 NOTE — Progress Notes (Signed)
 Patient ID: Kathleen Hendricks, female   DOB: [birth year], 43 y.o.   MRN: [id number]        Chief Complaint: follow up low back pain, dm with obesity, hld, low vit d       HPI:  Kathleen Hendricks is a 43 y.o. female here with c/o acute onset mild to mod right lower back pain without radiation  Denies urinary symptoms such as dysuria, frequency, urgency, flank pain, hematuria or n/v, fever, chills but would like UA.  Pt denies chest pain, increased sob or doe, wheezing, orthopnea, PND, increased LE swelling, palpitations, dizziness or syncope.   Pt denies polydipsia, polyuria, or new focal neuro s/s.   Unable to lose significant wt with low dose        Wt Readings from Last 3 Encounters:  10/30/23 245 lb (111.1 kg)  07/24/23 251 lb (113.9 kg)  07/27/22 240 lb (108.9 kg)   BP Readings from Last 3 Encounters:  10/30/23 122/80  07/24/23 124/78  07/27/22 110/70         Past Medical History:  Diagnosis Date   ANXIETY 03/24/2007   Qualifier: Diagnosis of  By: Jonny Ruiz MD, Len Blalock    Common migraine 03/21/2007   Qualifier: Diagnosis of  By: Maris Berger    Complication of anesthesia    woke up during surgery trying to get off table   DEPRESSION 03/24/2007   Qualifier: Diagnosis of  By: Jonny Ruiz MD, Len Blalock    Gestational diabetes    Headache(784.0)    Obesity (BMI 35.0-39.9 without comorbidity)    Obesity, unspecified 04/02/2014   Other and unspecified hyperlipidemia 04/02/2014   Past Surgical History:  Procedure Laterality Date   CARPAL TUNNEL RELEASE     CESAREAN SECTION N/A 05/20/2022   Procedure: CESAREAN SECTION;  Surgeon: Carlisle Cater, MD;  Location: [facility] LD ORS;  Service: Obstetrics;  Laterality: N/A;   GANGLION CYST EXCISION     left wrist   PILONIDAL CYST EXCISION      reports that she has never smoked. She has never used smokeless tobacco. She reports that she does not drink alcohol and does not use drugs. family history includes Arthritis in her mother; Cancer in her  paternal grandmother; Diabetes in her mother; Heart disease in her father and mother; Hyperlipidemia in her father and mother; Hypertension in her father and mother. No Known Allergies Current Outpatient Medications on File Prior to Visit  Medication Sig Dispense Refill   ALPRAZolam (XANAX) 0.5 MG tablet Take 1 tablet (0.5 mg total) by mouth 2 (two) times daily as needed for anxiety. 60 tablet 1   predniSONE (DELTASONE) 10 MG tablet 3 tabs by mouth per day for 3 days,2tabs per day for 3 days,1tab per day for 3 days 18 tablet 0   promethazine-dextromethorphan (PROMETHAZINE-DM) 6.25-15 MG/5ML syrup Take 5 mLs by mouth 4 (four) times daily as needed. 118 mL 0   acetaminophen (TYLENOL) 500 MG tablet Take 500 mg by mouth every 6 (six) hours as needed for mild pain. (Patient not taking: Reported on 07/24/2023)     albuterol (VENTOLIN HFA) 108 (90 Base) MCG/ACT inhaler Inhale 2 puffs into the lungs every 6 (six) hours as needed for wheezing or shortness of breath. (Patient not taking: Reported on 10/30/2023) 8 g 5   Prenatal Vit-Fe Fumarate-FA (PRENATAL MULTIVITAMIN) TABS tablet Take 1 tablet by mouth daily at 12 noon. (Patient not taking: Reported on 07/24/2023)     No current facility-administered medications on  file prior to visit.        ROS:  All others reviewed and negative.  Objective        PE:  BP 122/80 (BP Location: Right Arm, Patient Position: Sitting, Cuff Size: Normal)   Pulse 90   Temp 98.4 F (36.9 C) (Oral)   Ht 5\' 6"  (1.676 m)   Wt 245 lb (111.1 kg)   LMP 10/12/2023 (Approximate)   SpO2 98%   Breastfeeding No   BMI 39.54 kg/m                 Constitutional: Pt appears in NAD               HENT: Head: NCAT.                Right Ear: External ear normal.                 Left Ear: External ear normal.                Eyes: . Pupils are equal, round, and reactive to light. Conjunctivae and EOM are normal               Nose: without d/c or deformity               Neck: Neck  supple. Gross normal ROM               Cardiovascular: Normal rate and regular rhythm.                 Pulmonary/Chest: Effort normal and breath sounds without rales or wheezing.                Abd:  Soft, NT, ND, + BS, no organomegaly               Neurological: Pt is alert. At baseline orientation, motor grossly intact               Skin: Skin is warm. No rashes, no other new lesions, LE edema - none;  spine nontender in midline, has right lower back paraspinal spasm, tender               Psychiatric: Pt behavior is normal without agitation   Micro: none  Cardiac tracings I have personally interpreted today:  none  Pertinent Radiological findings (summarize): none   Lab Results  Component Value Date   WBC 7.2 07/24/2023   HGB 13.4 07/24/2023   HCT 40.7 07/24/2023   PLT 328.0 07/24/2023   GLUCOSE 94 07/24/2023   CHOL 162 07/24/2023   TRIG 64.0 07/24/2023   HDL 41.50 07/24/2023   LDLCALC 108 (H) 07/24/2023   ALT 36 (H) 07/24/2023   AST 27 07/24/2023   NA 138 07/24/2023   K 3.9 07/24/2023   CL 105 07/24/2023   CREATININE 0.70 07/24/2023   BUN 8 07/24/2023   CO2 26 07/24/2023   TSH 3.17 07/24/2023   HGBA1C 6.6 (H) 07/24/2023   MICROALBUR 2.3 (H) 07/24/2023   Assessment/Plan:  Kathleen Hendricks is a 43 y.o. Black or African American [2] female with  has a past medical history of ANXIETY (03/24/2007), Common migraine (03/21/2007), Complication of anesthesia, DEPRESSION (03/24/2007), Gestational diabetes, Headache(784.0), Obesity (BMI 35.0-39.9 without comorbidity), Obesity, unspecified (04/02/2014), and Other and unspecified hyperlipidemia (04/02/2014).  Vitamin D deficiency Last vitamin D Lab Results  Component Value Date   VD25OH 14.49 (L) 07/24/2023   Low, to start oral replacement  LOW BACK PAIN C/ msk strain - for flexeril 5 tid prn, also toradol 30 mg IM  HLD (hyperlipidemia) Lab Results  Component Value Date   LDLCALC 108 (H) 07/24/2023   Uncontrolled,  declines statin for now   Diabetes [facility]) Lab Results  Component Value Date   HGBA1C 6.6 (H) 07/24/2023   With uncontrolled obesity, pt for increased mounjaro 5 mg weekly  Followup: Return if symptoms worsen or fail to improve.  Oliver Barre, MD 10/30/2023 8:27 PM [facility] Internal Medicine

## 2023-10-30 NOTE — Assessment & Plan Note (Signed)
 Lab Results  Component Value Date   LDLCALC 108 (H) 07/24/2023   Uncontrolled, declines statin for now

## 2023-10-31 ENCOUNTER — Encounter: Payer: Self-pay | Admitting: Internal Medicine

## 2023-10-31 LAB — URINALYSIS, ROUTINE W REFLEX MICROSCOPIC
Bilirubin Urine: NEGATIVE
Hgb urine dipstick: NEGATIVE
Ketones, ur: NEGATIVE
Leukocytes,Ua: NEGATIVE
Nitrite: NEGATIVE
Specific Gravity, Urine: 1.02 (ref 1.000–1.030)
Total Protein, Urine: NEGATIVE
Urine Glucose: NEGATIVE
Urobilinogen, UA: 1 (ref 0.0–1.0)
pH: 6 (ref 5.0–8.0)

## 2023-11-02 ENCOUNTER — Telehealth: Payer: Self-pay

## 2023-11-02 ENCOUNTER — Other Ambulatory Visit (HOSPITAL_COMMUNITY): Payer: Self-pay

## 2023-11-02 NOTE — Telephone Encounter (Signed)
 Pharmacy Patient Advocate Encounter  Received notification from  [facility]  that Prior Authorization for HYDROcodone-Acetaminophen 10-325MG  tablets} has been CANCELLED BY  PROVIDER    PA #/Case ID/Reference #: [id number]

## 2023-11-03 ENCOUNTER — Other Ambulatory Visit (HOSPITAL_COMMUNITY): Payer: Self-pay

## 2023-11-09 ENCOUNTER — Other Ambulatory Visit (HOSPITAL_COMMUNITY): Payer: Self-pay

## 2023-11-16 ENCOUNTER — Encounter: Admitting: Internal Medicine

## 2023-11-21 ENCOUNTER — Encounter: Payer: Self-pay | Admitting: Internal Medicine

## 2023-11-21 MED ORDER — GABAPENTIN 100 MG PO CAPS: 100.0000 mg | ORAL_CAPSULE | Freq: Three times a day (TID) | ORAL | 3 refills | Status: AC

## 2024-04-05 ENCOUNTER — Ambulatory Visit: Admission: EM | Admit: 2024-04-05 | Discharge: 2024-04-05 | Disposition: A | Discharge status: Home / Self Care

## 2024-04-05 ENCOUNTER — Ambulatory Visit: Payer: Self-pay | Admitting: *Deleted

## 2024-04-05 DIAGNOSIS — H9201 Otalgia, right ear: Secondary | ICD-10-CM | POA: Diagnosis not present

## 2024-04-05 DIAGNOSIS — J020 Streptococcal pharyngitis: Secondary | ICD-10-CM

## 2024-04-05 DIAGNOSIS — J029 Acute pharyngitis, unspecified: Secondary | ICD-10-CM

## 2024-04-05 DIAGNOSIS — R051 Acute cough: Secondary | ICD-10-CM

## 2024-04-05 DIAGNOSIS — U071 COVID-19: Secondary | ICD-10-CM | POA: Diagnosis not present

## 2024-04-05 LAB — POC COVID19/FLU A&B COMBO
Covid Antigen, POC: POSITIVE — AB
Influenza A Antigen, POC: NEGATIVE
Influenza B Antigen, POC: NEGATIVE

## 2024-04-05 LAB — POCT RAPID STREP A (OFFICE): Rapid Strep A Screen: POSITIVE — AB

## 2024-04-05 MED ORDER — PROMETHAZINE-DM 6.25-15 MG/5ML PO SYRP: 5.0000 mL | ORAL_SOLUTION | Freq: Four times a day (QID) | ORAL | 0 refills | Status: DC | PRN

## 2024-04-05 MED ORDER — FLUTICASONE PROPIONATE 50 MCG/ACT NA SUSP: 1.0000 | Freq: Every day | NASAL | 0 refills | Status: AC | PRN

## 2024-04-05 MED ORDER — AMOXICILLIN 500 MG PO CAPS: 500.0000 mg | ORAL_CAPSULE | Freq: Two times a day (BID) | ORAL | 0 refills | Status: AC

## 2024-04-05 MED ORDER — PAXLOVID (300/100) 20 X 150 MG & 10 X 100MG PO TBPK: 3.0000 | ORAL_TABLET | Freq: Two times a day (BID) | ORAL | 0 refills | Status: DC

## 2024-04-05 NOTE — Discharge Instructions (Addendum)
 You are positive for COVID. COVID is a virus. A prescription (Paxlovid ) was sent to your pharmacy to help with the duration of symptoms. Recommend you rest and increase oral fluids. Tylenol /Ibuprofen  as needed for fevers and aches.   It is important for you to pay attention to any new symptoms or worsening of your current condition. Please go directly to the Emergency Department immediately should you have any of the following symptoms: chest pain, shortness of breath or difficulty breathing.  I have also sent a cough medicine to your pharmacy. You should take this at bedtime when you are not working or driving because it can make you sleepy. Do NOT take it with the Xanax  or the Gabapentin .   Your strep test was positive. I have sent an antibiotic called Amoxicillin  to your pharmacy. This is often used to treat strep throat. Recommend salt water  gargling and over-the-counter Ibuprofen /Tylenol  as needed for pain if you are not allergic. Please go directly to the Emergency Department immediately should you begin to have any of the following symptoms: increased pain, persistent fevers, difficulty swallowing, difficulty talking, drooling or difficulty breathing.

## 2024-04-05 NOTE — Telephone Encounter (Signed)
 FYI Only or Action Required?: FYI only for provider.  Patient was last seen in primary care on 10/30/2023 by Norleen Lynwood ORN, MD.  Called Nurse Triage reporting Sore Throat and Otalgia.  Symptoms began today.  Interventions attempted: Rest, hydration, or home remedies.  Symptoms are: gradually worsening.  Triage Disposition: See Physician Within 24 Hours  Patient/caregiver understands and will follow disposition?: Yes   Reason for Disposition  SEVERE throat pain (e.g., excruciating)  Answer Assessment - Initial Assessment Questions 1. ONSET: When did the throat start hurting? (Hours or days ago)      Early am-today 2. SEVERITY: How bad is the sore throat? (Scale 1-10; mild, moderate or severe)     10/10- burning pain 3. STREP EXPOSURE: Has there been any exposure to strep within the past week? If Yes, ask: What type of contact occurred?      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  VIRAL SYMPTOMS: Are there any symptoms of a cold, such as a runny nose, cough, hoarse voice or red eyes?      Runny nose, R ear pain 5. FEVER: Do you have a fever? If Yes, ask: What is your temperature, how was it measured, and when did it start?     no 6. PUS ON THE TONSILS: Is there pus on the tonsils in the back of your throat?     Not looked 7. OTHER SYMPTOMS: Do you have any other symptoms? (e.g., difficulty breathing, headache, rash)     Headache, stiffness in back  Protocols used: Sore Throat-A-AH   Copied from CRM #[id number]. Topic: Clinical - Red Word Triage >> Apr 05, 2024  4:16 PM [place] J wrote: [facility] that prompted transfer to Nurse Triage: Sore throat, runny nose, ear & back pain, headaches, stiffness in lower back.

## 2024-04-05 NOTE — ED Provider Notes (Signed)
 [facility]  Note:  This document was prepared using Dragon voice recognition software and may include unintentional dictation errors.  MRN: [id number] DOB: [birth year] DATE: 04/05/24   Subjective:  Chief Complaint:  Chief Complaint  Patient presents with   Sore Throat   Otalgia     HPI: Kathleen Hendricks is a 43 y.o. female presenting for sore throat and right otalgia for less than one day. Patient states she started with general malaise and sore throat this morning. She reports fatigue and nausea as well. She states she has since developed a slight cough and congestion. She reports drinking tea and soup with no improvement. She has not taken anything for her symptoms. 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 Denies fever, vomiting, abdominal pain. Endorses sore throat, cough, congestion, nausea, headache, myalgias. Presents NAD.  Prior to Admission medications   Medication Sig Start Date End Date Taking? Authorizing Provider  acetaminophen  (TYLENOL ) 500 MG tablet Take 500 mg by mouth every 6 (six) hours as needed for mild pain.   Yes [provider]  albuterol  (VENTOLIN  HFA) 108 (90 Base) MCG/ACT inhaler Inhale 2 puffs into the lungs every 6 (six) hours as needed for wheezing or shortness of breath. 07/27/22  Yes Norleen Lynwood ORN, MD  ALPRAZolam  (XANAX ) 0.5 MG tablet Take 1 tablet (0.5 mg total) by mouth 2 (two) times daily as needed for anxiety. 07/24/23  Yes Norleen Lynwood ORN, MD  cyclobenzaprine  (FLEXERIL ) 5 MG tablet Take 1 tablet (5 mg total) by mouth 3 (three) times daily as needed. 10/30/23  Yes Norleen Lynwood ORN, MD  gabapentin  (NEURONTIN ) 100 MG capsule Take 1 capsule (100 mg total) by mouth 3 (three) times daily. 11/21/23  Yes Norleen Lynwood ORN, MD     No Known Allergies  History:   Past Medical History:  Diagnosis Date   ANXIETY 03/24/2007   Qualifier: Diagnosis of  By: Norleen MD, Lynwood ORN    Common migraine 03/21/2007    Qualifier: Diagnosis of  By: Helga Almarie Caldron    Complication of anesthesia    woke up during surgery trying to get off table   DEPRESSION 03/24/2007   Qualifier: Diagnosis of  By: Norleen MD, Lynwood ORN    Gestational diabetes    Headache(784.0)    Obesity (BMI 35.0-39.9 without comorbidity)    Obesity, unspecified 04/02/2014   Other and unspecified hyperlipidemia 04/02/2014     Past Surgical History:  Procedure Laterality Date   CARPAL TUNNEL RELEASE     CESAREAN SECTION N/A 05/20/2022   Procedure: CESAREAN SECTION;  Surgeon: Sudie Lavonia HERO, MD;  Location: [facility] LD ORS;  Service: Obstetrics;  Laterality: N/A;   GANGLION CYST EXCISION     left wrist   PILONIDAL CYST EXCISION      Family History  Problem Relation Age of Onset   Diabetes Mother    Hypertension Mother    Hyperlipidemia Mother    Heart disease Mother        CAD   Arthritis Mother        back surgery   Hypertension Father    Hyperlipidemia Father    Heart disease Father        CAD   Cancer Paternal Grandmother        breast    Social History   Tobacco Use   Smoking status: Never    Passive exposure: Never   Smokeless tobacco: Never  Vaping Use  Vaping status: Never Used  Substance Use Topics   Alcohol use: No   Drug use: No    Review of Systems  Constitutional:  Positive for appetite change and fatigue. Negative for fever.  HENT:  Positive for congestion, ear pain, rhinorrhea, sinus pressure and sore throat.   Respiratory:  Positive for cough.   Gastrointestinal:  Positive for nausea. Negative for abdominal pain and vomiting.  Musculoskeletal:  Positive for myalgias.  Neurological:  Positive for headaches.     Objective:   Vitals: BP 126/85 (BP Location: Right Arm)   Pulse 98   Temp 98.4 F (36.9 C) (Oral)   Resp 18   LMP 03/22/2024 (Approximate)   SpO2 96%   Breastfeeding No   Physical Exam Constitutional:      General: She is not in acute distress.    Appearance: Normal  appearance. She is well-developed. She is morbidly obese. She is not ill-appearing or toxic-appearing.  HENT:     Head: Normocephalic and atraumatic.     Right Ear: Ear canal normal. A middle ear effusion is present.     Left Ear: Ear canal normal. A middle ear effusion is present.     Mouth/Throat:     Pharynx: Uvula midline. Posterior oropharyngeal erythema present. No pharyngeal swelling or oropharyngeal exudate.     Tonsils: No tonsillar exudate or tonsillar abscesses.  Cardiovascular:     Rate and Rhythm: Normal rate and regular rhythm.     Heart sounds: Normal heart sounds.  Pulmonary:     Effort: Pulmonary effort is normal.     Breath sounds: Normal breath sounds.     Comments: Clear to auscultation bilaterally  Abdominal:     General: Bowel sounds are normal.     Palpations: Abdomen is soft.     Tenderness: There is no abdominal tenderness.  Lymphadenopathy:     Cervical: No cervical adenopathy.     Right cervical: No superficial cervical adenopathy.    Left cervical: No superficial cervical adenopathy.  Skin:    General: Skin is warm and dry.  Neurological:     General: No focal deficit present.     Mental Status: She is alert.  Psychiatric:        Mood and Affect: Mood and affect normal.     Results:  Labs: Results for orders placed or performed during the hospital encounter of 04/05/24 (from the past 24 hours)  POC Covid19/Flu A&B Antigen     Status: Abnormal   Collection Time: 04/05/24  5:46 PM  Result Value Ref Range   Influenza A Antigen, POC Negative Negative   Influenza B Antigen, POC Negative Negative   Covid Antigen, POC Positive (A) Negative  POCT rapid strep A     Status: Abnormal   Collection Time: 04/05/24  5:46 PM  Result Value Ref Range   Rapid Strep A Screen Positive (A) Negative    Radiology: No results found.   UC Course/Treatments:  Procedures: Procedures   Medications Ordered in UC: Medications - No data to display   Assessment  and Plan :     ICD-10-CM   1. Streptococcal sore throat  J02.0     2. Acute pharyngitis, unspecified etiology  J02.9 POC Covid19/Flu A&B Antigen    POCT rapid strep A    POC Covid19/Flu A&B Antigen    POCT rapid strep A    3. Acute otalgia, right  H92.01     4. COVID-19  U07.1  5. Acute cough  R05.1      Streptococcal sore throat Acute pharyngitis, unspecified etiology Afebrile, nontoxic-appearing, NAD. VSS. DDX includes but not limited to: strep, mono, viral pharyngitis, post nasal drip COVID and strep were positive today in office.  Amoxicillin  500 mg twice daily was prescribed. Recommend salt water  gargles and over-the-counter throat spray/lozenges. Recommend OTC analgesics as needed for pain/fevers. Strict ED precautions were given and patient verbalized understanding.  COVID-19 Acute cough Afebrile, nontoxic-appearing, NAD. VSS. DDX includes but not limited to: COVID, flu, bronchitis, pneumonia, viral URI COVID was positive today in office.  Paxlovid  300/100 mg twice daily was prescribed. Promethazine -DM QID PRN was prescribed for cough and Flonase  1 spray each nostril every day PRN was prescribed for congestion and post nasal drip.  Patient was instructed to increase fluids and rest. Recommend OTC analgesics as needed for pain/fevers. Strict ED precautions were given and patient verbalized understanding.  Acute otalgia, right Afebrile, nontoxic-appearing, NAD. VSS. DDX includes but not limited to: otitis media, otitis externa, eustachian tube dysfunction, cerumen impaction Exam was positive for bilateral effusions.  No signs of secondary infection.  Suspect likely viral illness and possible referred pain. Flonase  1 spray each nostril every day PRN was prescribed for congestion and post nasal drip. Recommend OTC analgesics as needed for pain. Strict ED precautions were given and patient verbalized understanding.  ED Discharge Orders          Ordered     promethazine -dextromethorphan (PROMETHAZINE -DM) 6.25-15 MG/5ML syrup  4 times daily PRN        04/05/24 1746    fluticasone  (FLONASE ) 50 MCG/ACT nasal spray  Daily PRN        04/05/24 1746    nirmatrelvir/ritonavir (PAXLOVID , 300/100,) 20 x 150 MG & 10 x 100MG  TBPK  2 times daily        04/05/24 1746    amoxicillin  (AMOXIL ) 500 MG capsule  2 times daily        04/05/24 1746             PDMP not reviewed this encounter.     Basilia Ulanda SQUIBB, PA-C 04/05/24 1753

## 2024-04-05 NOTE — ED Triage Notes (Signed)
 Pt states since this morning she has had a sore throat, right sided otalgia, nausea, headache, back stiffness, and fatigue. She has tried tea and soup.  Pt denies any known exposures.

## 2024-04-09 ENCOUNTER — Ambulatory Visit: Admitting: Internal Medicine

## 2024-04-10 ENCOUNTER — Ambulatory Visit: Admitting: Internal Medicine

## 2024-04-10 ENCOUNTER — Encounter: Payer: Self-pay | Admitting: Internal Medicine

## 2024-04-10 VITALS — BP 126/86 | HR 93 | Temp 98.6°F | Ht 66.0 in | Wt 254.2 lb

## 2024-04-10 DIAGNOSIS — J020 Streptococcal pharyngitis: Secondary | ICD-10-CM

## 2024-04-10 DIAGNOSIS — E1165 Type 2 diabetes mellitus with hyperglycemia: Secondary | ICD-10-CM

## 2024-04-10 DIAGNOSIS — E559 Vitamin D deficiency, unspecified: Secondary | ICD-10-CM

## 2024-04-10 DIAGNOSIS — U071 COVID-19: Secondary | ICD-10-CM | POA: Diagnosis not present

## 2024-04-10 MED ORDER — DOXYCYCLINE HYCLATE 100 MG PO TABS: 100.0000 mg | ORAL_TABLET | Freq: Two times a day (BID) | ORAL | 0 refills | Status: DC

## 2024-04-10 MED ORDER — HYDROCODONE BIT-HOMATROP MBR 5-1.5 MG/5ML PO SOLN: 5.0000 mL | Freq: Four times a day (QID) | ORAL | 0 refills | Status: AC | PRN

## 2024-04-10 NOTE — Progress Notes (Signed)
 Patient ID: Kathleen Hendricks, female   DOB: 1980-09-23, 43 y.o.   MRN: [id number]        Chief Complaint: follow up aug 29 UC visit with Rapid Strep positive, and COVID positive as well, dm, low vit d       HPI:  Kathleen Hendricks is a 43 y.o. female here with recent positive for strep and covid infections aug 29 at UC, tx with paxlovid  course, and amoxil  ; has some decreased in nasal congestion, but can't sleep with ongoing cough, and ST pain just not getting better.  No high fever, chills.  Pt denies chest pain, increased sob or doe, wheezing, orthopnea, PND, increased LE swelling, palpitations, dizziness or syncope.   Pt denies polydipsia, polyuria, or new focal neuro s/s.          Wt Readings from Last 3 Encounters:  04/10/24 254 lb 3.2 oz (115.3 kg)  10/30/23 245 lb (111.1 kg)  07/24/23 251 lb (113.9 kg)   BP Readings from Last 3 Encounters:  04/10/24 126/86  04/05/24 126/85  10/30/23 122/80         Past Medical History:  Diagnosis Date   ANXIETY 03/24/2007   Qualifier: Diagnosis of  By: Norleen MD, Lynwood ORN    Common migraine 03/21/2007   Qualifier: Diagnosis of  By: Helga Almarie Caldron    Complication of anesthesia    woke up during surgery trying to get off table   DEPRESSION 03/24/2007   Qualifier: Diagnosis of  By: Norleen MD, Lynwood ORN    Gestational diabetes    Headache(784.0)    Obesity (BMI 35.0-39.9 without comorbidity)    Obesity, unspecified 04/02/2014   Other and unspecified hyperlipidemia 04/02/2014   Past Surgical History:  Procedure Laterality Date   CARPAL TUNNEL RELEASE     CESAREAN SECTION N/A 05/20/2022   Procedure: CESAREAN SECTION;  Surgeon: Sudie Lavonia HERO, MD;  Location: [facility];  Service: Obstetrics;  Laterality: N/A;   GANGLION CYST EXCISION     left wrist   PILONIDAL CYST EXCISION      reports that she has never smoked. She has never been exposed to tobacco smoke. She has never used smokeless tobacco. She reports that she does not drink alcohol  and does not use drugs. family history includes Arthritis in her mother; Cancer in her paternal grandmother; Diabetes in her mother; Heart disease in her father and mother; Hyperlipidemia in her father and mother; Hypertension in her father and mother. No Known Allergies Current Outpatient Medications on File Prior to Visit  Medication Sig Dispense Refill   acetaminophen  (TYLENOL ) 500 MG tablet Take 500 mg by mouth every 6 (six) hours as needed for mild pain.     albuterol  (VENTOLIN  HFA) 108 (90 Base) MCG/ACT inhaler Inhale 2 puffs into the lungs every 6 (six) hours as needed for wheezing or shortness of breath. 8 g 5   ALPRAZolam  (XANAX ) 0.5 MG tablet Take 1 tablet (0.5 mg total) by mouth 2 (two) times daily as needed for anxiety. 60 tablet 1   amoxicillin  (AMOXIL ) 500 MG capsule Take 1 capsule (500 mg total) by mouth 2 (two) times daily for 10 days. 20 capsule 0   cyclobenzaprine  (FLEXERIL ) 5 MG tablet Take 1 tablet (5 mg total) by mouth 3 (three) times daily as needed. 40 tablet 1   fluticasone  (FLONASE ) 50 MCG/ACT nasal spray Place 1 spray into both nostrils daily as needed. 18.2 mL 0   gabapentin  (NEURONTIN ) 100 MG capsule  Take 1 capsule (100 mg total) by mouth 3 (three) times daily. 90 capsule 3   promethazine -dextromethorphan (PROMETHAZINE -DM) 6.25-15 MG/5ML syrup Take 5 mLs by mouth 4 (four) times daily as needed for cough. 180 mL 0   No current facility-administered medications on file prior to visit.        ROS:  All others reviewed and negative.  Objective        PE:  BP 126/86 (BP Location: Right Arm, Patient Position: Sitting, Cuff Size: Large)   Pulse 93   Temp 98.6 F (37 C) (Oral)   Ht 5' 6 (1.676 m)   Wt 254 lb 3.2 oz (115.3 kg)   LMP 03/22/2024 (Approximate)   SpO2 100%   BMI 41.03 kg/m                 Constitutional: Pt appears midl ill               HENT: Head: NCAT.                Right Ear: External ear normal.                 Left Ear: External ear normal.  Bilat tm's with mild erythema.  Max sinus areas mild tender.  Pharynx with mild erythema, no exudate               Eyes: . Pupils are equal, round, and reactive to light. Conjunctivae and EOM are normal               Nose: without d/c or deformity               Neck: Neck supple. Gross normal ROM               Cardiovascular: Normal rate and regular rhythm.                 Pulmonary/Chest: Effort normal and breath sounds without rales or wheezing.                               Neurological: Pt is alert. At baseline orientation, motor grossly intact               Skin: Skin is warm. No rashes, no other new lesions, LE edema - none               Psychiatric: Pt behavior is normal without agitation   Micro: none  Cardiac tracings I have personally interpreted today:  none  Pertinent Radiological findings (summarize): none   Lab Results  Component Value Date   WBC 7.2 07/24/2023   HGB 13.4 07/24/2023   HCT 40.7 07/24/2023   PLT 328.0 07/24/2023   GLUCOSE 94 07/24/2023   CHOL 162 07/24/2023   TRIG 64.0 07/24/2023   HDL 41.50 07/24/2023   LDLCALC 108 (H) 07/24/2023   ALT 36 (H) 07/24/2023   AST 27 07/24/2023   NA 138 07/24/2023   K 3.9 07/24/2023   CL 105 07/24/2023   CREATININE 0.70 07/24/2023   BUN 8 07/24/2023   CO2 26 07/24/2023   TSH 3.17 07/24/2023   HGBA1C 6.6 (H) 07/24/2023   Assessment/Plan:  Kathleen Hendricks is a 43 y.o. Black or African American [2] female with  has a past medical history of ANXIETY (03/24/2007), Common migraine (03/21/2007), Complication of anesthesia, DEPRESSION (03/24/2007), Gestational diabetes, Headache(784.0), Obesity (BMI 35.0-39.9 without comorbidity), Obesity, unspecified (  04/02/2014), and Other and unspecified hyperlipidemia (04/02/2014).  COVID-19 Mild to mod, for continued finish paxlovid  course, work note given, also cough med prn,  to f/u any worsening symptoms or concerns  Diabetes (HCC) Lab Results  Component Value Date   HGBA1C  6.6 (H) 07/24/2023   Stable, pt to continue current medical treatment  - diet, wt control   Strep throat Pt with lack of improvement subjectively, ok for change amoxil  to doxycyline 100 bid course,  to f/u any worsening symptoms or concerns  Vitamin D  deficiency Last vitamin D  Lab Results  Component Value Date   VD25OH 14.49 (L) 07/24/2023   Low, to start oral replacement  Followup: Return if symptoms worsen or fail to improve.  Lynwood Rush, MD 04/10/2024 8:49 PM [facility]

## 2024-04-10 NOTE — Patient Instructions (Signed)
 Ok to finish the Paxlovid   Ok to change the Amoxil  to Doxycyline  Please take all new medication as prescribed - the cough medicine  Please continue all other medications as before, and refills have been done if requested.  Please have the pharmacy call with any other refills you may need.  Please keep your appointments with your specialists as you may have planned  You are given the work note today

## 2024-04-10 NOTE — Assessment & Plan Note (Signed)
 Mild to mod, for continued finish paxlovid  course, work note given, also cough med prn,  to f/u any worsening symptoms or concerns

## 2024-04-10 NOTE — Assessment & Plan Note (Signed)
 Lab Results  Component Value Date   HGBA1C 6.6 (H) 07/24/2023   Stable, pt to continue current medical treatment  - diet, wt control

## 2024-04-10 NOTE — Assessment & Plan Note (Signed)
 Pt with lack of improvement subjectively, ok for change amoxil  to doxycyline 100 bid course,  to f/u any worsening symptoms or concerns

## 2024-04-10 NOTE — Assessment & Plan Note (Signed)
 Last vitamin D Lab Results  Component Value Date   VD25OH 14.49 (L) 07/24/2023   Low, to start oral replacement

## 2024-06-05 ENCOUNTER — Ambulatory Visit: Payer: Self-pay

## 2024-06-05 NOTE — Telephone Encounter (Signed)
 FYI Only or Action Required?: FYI only for provider: appointment scheduled on 10/30.  Patient was last seen in primary care on 04/10/2024 by Norleen Lynwood ORN, MD.  Called Nurse Triage reporting Abdominal Pain.  Symptoms began today.  Interventions attempted: Rest, hydration, or home remedies.  Symptoms are: gradually worsening.  Triage Disposition: See Physician Within 24 Hours  Patient/caregiver understands and will follow disposition?: Yes  Message from [facility] S sent at 06/05/2024  4:10 PM EDT   Reason for Triage: pt called because she has runny diarrhea with stomach pains. Call back number [phone]   Reason for Disposition  [1] MODERATE pain (e.g., interferes with normal activities) AND [2] pain comes and goes (cramps) AND [3] present > 24 hours  (Exception: Pain with Vomiting or Diarrhea - see that Guideline.)  Answer Assessment - Initial Assessment Questions Stomach pain started today, coming and going. All over her abdomen but feels it more in upper stomach. Diarrhea- 3x. Straight water - denies Abx use, fever, CP or SOB.  Bloated last night. No nausea or vomiting.  Diet today of Salad, carrots, and chicken. Very hydrated. So far everything has stayed down. She has Hx of ileus 3 years ago. On Wegovy at the time but was ruled as not contributing. Denies feeling similar pain. The pain is bad when it hits with some relief with BM.   Appt made with PCP office. ED/UC precautions given and understood.    1. LOCATION: Where does it hurt?      All over her stomach- but more towards the upper stomach  2. RADIATION: Does the pain shoot anywhere else? (e.g., chest, back)     denies 3. ONSET: When did the pain begin? (e.g., minutes, hours or days ago)      Today  4. SUDDEN: Gradual or sudden onset?     Sudden onset 5. PATTERN Does the pain come and go, or is it constant?     Coming and going  6. SEVERITY: How bad is the pain?  (e.g., Scale 1-10; mild, moderate, or  severe)     9/10- 12/10 pain 7. RECURRENT SYMPTOM: Have you ever had this type of stomach pain before? If Yes, ask: When was the last time? and What happened that time?      Illeus 3 years ago.  8. CAUSE: What do you think is causing the stomach pain? (e.g., gallstones, recent abdominal surgery)     Unsure- doesn't feel like when she had the illeus 9. RELIEVING/AGGRAVATING FACTORS: What makes it better or worse? (e.g., antacids, bending or twisting motion, bowel movement)     BM helps some  10. OTHER SYMPTOMS: Do you have any other symptoms? (e.g., back pain, diarrhea, fever, urination pain, vomiting)       Diarrhea 3x today so far- water   Protocols used: Abdominal Pain - [facility]

## 2024-06-05 NOTE — Telephone Encounter (Signed)
 VM Full and unable to leave message to call back to triage symptoms. Attempt #1  Message from [facility] S sent at 06/05/2024  4:10 PM EDT  Reason for Triage: pt called because she has runny diarrhea with stomach pains. Call back number [phone]

## 2024-06-06 ENCOUNTER — Encounter: Payer: Self-pay | Admitting: Internal Medicine

## 2024-06-06 ENCOUNTER — Ambulatory Visit: Admitting: Internal Medicine

## 2024-06-06 VITALS — BP 96/62 | HR 96 | Temp 98.8°F | Ht 66.0 in | Wt 249.0 lb

## 2024-06-06 DIAGNOSIS — R5383 Other fatigue: Secondary | ICD-10-CM

## 2024-06-06 DIAGNOSIS — N760 Acute vaginitis: Secondary | ICD-10-CM

## 2024-06-06 DIAGNOSIS — B9689 Other specified bacterial agents as the cause of diseases classified elsewhere: Secondary | ICD-10-CM

## 2024-06-06 DIAGNOSIS — R197 Diarrhea, unspecified: Secondary | ICD-10-CM | POA: Diagnosis not present

## 2024-06-06 LAB — SEDIMENTATION RATE: Sed Rate: 55 mm/h — ABNORMAL HIGH (ref 0–20)

## 2024-06-06 LAB — CBC WITH DIFFERENTIAL/PLATELET
Basophils Absolute: 0.1 K/uL (ref 0.0–0.1)
Basophils Relative: 0.8 % (ref 0.0–3.0)
Eosinophils Absolute: 0.1 K/uL (ref 0.0–0.7)
Eosinophils Relative: 2.1 % (ref 0.0–5.0)
HCT: 39.1 % (ref 36.0–46.0)
Hemoglobin: 13 g/dL (ref 12.0–15.0)
Lymphocytes Relative: 29.5 % (ref 12.0–46.0)
Lymphs Abs: 2.1 K/uL (ref 0.7–4.0)
MCHC: 33.2 g/dL (ref 30.0–36.0)
MCV: 84.5 fl (ref 78.0–100.0)
Monocytes Absolute: 0.5 K/uL (ref 0.1–1.0)
Monocytes Relative: 6.7 % (ref 3.0–12.0)
Neutro Abs: 4.2 K/uL (ref 1.4–7.7)
Neutrophils Relative %: 60.9 % (ref 43.0–77.0)
Platelets: 315 K/uL (ref 150.0–400.0)
RBC: 4.63 Mil/uL (ref 3.87–5.11)
RDW: 14 % (ref 11.5–15.5)
WBC: 7 K/uL (ref 4.0–10.5)

## 2024-06-06 LAB — COMPREHENSIVE METABOLIC PANEL WITH GFR
ALT: 38 U/L — ABNORMAL HIGH (ref 0–35)
AST: 32 U/L (ref 0–37)
Albumin: 4.4 g/dL (ref 3.5–5.2)
Alkaline Phosphatase: 73 U/L (ref 39–117)
BUN: 5 mg/dL — ABNORMAL LOW (ref 6–23)
CO2: 25 meq/L (ref 19–32)
Calcium: 9 mg/dL (ref 8.4–10.5)
Chloride: 106 meq/L (ref 96–112)
Creatinine, Ser: 0.63 mg/dL (ref 0.40–1.20)
GFR: 106.95 mL/min (ref >60.00)
Glucose, Bld: 96 mg/dL (ref 70–99)
Potassium: 3.7 meq/L (ref 3.5–5.1)
Sodium: 138 meq/L (ref 135–145)
Total Bilirubin: 0.5 mg/dL (ref 0.2–1.2)
Total Protein: 8 g/dL (ref 6.0–8.3)

## 2024-06-06 MED ORDER — CIPROFLOXACIN HCL 500 MG PO TABS: 500.0000 mg | ORAL_TABLET | Freq: Two times a day (BID) | ORAL | 0 refills | Status: DC

## 2024-06-06 NOTE — Progress Notes (Signed)
 Subjective:  Patient ID: Kathleen Hendricks, female    DOB: 1981-05-05  Age: 43 y.o. MRN: [id number]  CC: Abdominal Pain (Abdominal pain, diarrhea starting yesterday. History of ileus (3 years ago))   HPI DULCY SIDA presents for watery diarrhea, arthralgias 6-8 times, night time tenesmus x2-3.  No blood or mucus in the stool.  No diarrhea this morning yet. C/o arthralgias and myalgias.  No rash.  No history of Crohn's or colitis.  The patient has a 81-year-old son who goes to daycare.  She took him to see his pediatrician a couple days ago for a well visit. No n/v.  No fever  Outpatient Medications Prior to Visit  Medication Sig Dispense Refill   acetaminophen  (TYLENOL ) 500 MG tablet Take 500 mg by mouth every 6 (six) hours as needed for mild pain.     albuterol  (VENTOLIN  HFA) 108 (90 Base) MCG/ACT inhaler Inhale 2 puffs into the lungs every 6 (six) hours as needed for wheezing or shortness of breath. 8 g 5   ALPRAZolam  (XANAX ) 0.5 MG tablet Take 1 tablet (0.5 mg total) by mouth 2 (two) times daily as needed for anxiety. 60 tablet 1   cyclobenzaprine  (FLEXERIL ) 5 MG tablet Take 1 tablet (5 mg total) by mouth 3 (three) times daily as needed. 40 tablet 1   doxycycline  (VIBRA -TABS) 100 MG tablet Take 1 tablet (100 mg total) by mouth 2 (two) times daily. 20 tablet 0   fluticasone  (FLONASE ) 50 MCG/ACT nasal spray Place 1 spray into both nostrils daily as needed. 18.2 mL 0   gabapentin  (NEURONTIN ) 100 MG capsule Take 1 capsule (100 mg total) by mouth 3 (three) times daily. 90 capsule 3   promethazine -dextromethorphan (PROMETHAZINE -DM) 6.25-15 MG/5ML syrup Take 5 mLs by mouth 4 (four) times daily as needed for cough. 180 mL 0   No facility-administered medications prior to visit.    ROS: Review of Systems  Constitutional:  Positive for chills and fatigue. Negative for activity change, appetite change, fever and unexpected weight change.  HENT:  Negative for congestion, mouth sores and sinus  pressure.   Eyes:  Negative for visual disturbance.  Respiratory:  Negative for cough and chest tightness.   Gastrointestinal:  Positive for abdominal pain, diarrhea and nausea. Negative for anal bleeding, blood in stool, rectal pain and vomiting.  Genitourinary:  Negative for difficulty urinating, frequency and vaginal pain.  Musculoskeletal:  Positive for arthralgias and myalgias. Negative for back pain and gait problem.  Skin:  Negative for pallor and rash.  Neurological:  Negative for dizziness, tremors, weakness, numbness and headaches.  Psychiatric/Behavioral:  Negative for confusion and sleep disturbance.     Objective:  BP 96/62   Pulse 96   Temp 98.8 F (37.1 C)   Ht 5' 6 (1.676 m)   Wt 249 lb (112.9 kg)   SpO2 99%   BMI 40.19 kg/m   BP Readings from Last 3 Encounters:  06/06/24 96/62  04/10/24 126/86  04/05/24 126/85    Wt Readings from Last 3 Encounters:  06/06/24 249 lb (112.9 kg)  04/10/24 254 lb 3.2 oz (115.3 kg)  10/30/23 245 lb (111.1 kg)    Physical Exam Constitutional:      General: She is not in acute distress.    Appearance: She is well-developed. She is obese.  HENT:     Head: Normocephalic.     Right Ear: External ear normal.     Left Ear: External ear normal.     Nose:  Nose normal.  Eyes:     General:        Right eye: No discharge.        Left eye: No discharge.     Conjunctiva/sclera: Conjunctivae normal.     Pupils: Pupils are equal, round, and reactive to light.  Neck:     Thyroid: No thyromegaly.     Vascular: No JVD.     Trachea: No tracheal deviation.  Cardiovascular:     Rate and Rhythm: Normal rate and regular rhythm.     Heart sounds: Normal heart sounds.  Pulmonary:     Effort: No respiratory distress.     Breath sounds: No stridor. No wheezing.  Abdominal:     General: Bowel sounds are normal. There is no distension.     Palpations: Abdomen is soft. There is no mass.     Tenderness: There is generalized abdominal  tenderness. There is no guarding or rebound. Negative signs include Murphy's sign and McBurney's sign.  Musculoskeletal:        General: No tenderness.     Cervical back: Normal range of motion and neck supple. No rigidity.  Lymphadenopathy:     Cervical: No cervical adenopathy.  Skin:    Findings: No erythema or rash.  Neurological:     Cranial Nerves: No cranial nerve deficit.     Motor: No abnormal muscle tone.     Coordination: Coordination normal.     Deep Tendon Reflexes: Reflexes normal.  Psychiatric:        Behavior: Behavior normal.        Thought Content: Thought content normal.        Judgment: Judgment normal.   The patient appears to be in no acute distress.  Her abdomen is nondistended, soft, tender to palpation diffusely, primarily in the left lower quadrant and left upper quadrant.  No rebound symptoms  Lab Results  Component Value Date   WBC 7.2 07/24/2023   HGB 13.4 07/24/2023   HCT 40.7 07/24/2023   PLT 328.0 07/24/2023   GLUCOSE 94 07/24/2023   CHOL 162 07/24/2023   TRIG 64.0 07/24/2023   HDL 41.50 07/24/2023   LDLCALC 108 (H) 07/24/2023   ALT 36 (H) 07/24/2023   AST 27 07/24/2023   NA 138 07/24/2023   K 3.9 07/24/2023   CL 105 07/24/2023   CREATININE 0.70 07/24/2023   BUN 8 07/24/2023   CO2 26 07/24/2023   TSH 3.17 07/24/2023   HGBA1C 6.6 (H) 07/24/2023    No results found.  Assessment & Plan:   Problem List Items Addressed This Visit     Bacterial vaginosis   Recent, resolved      Diarrhea - Primary   Likely acute gastroenteritis, probably viral.  Take Imodium as needed.  Clear liquids, advance diet as tolerated I give her prescription for Cipro in case her symptoms relapsed Return to clinic if problems We will obtain sed rate, CBC, c-Met Back to work on Tuesday if okay      Relevant Orders   CBC with Differential/Platelet   Comprehensive metabolic panel with GFR   Sedimentation rate   Fatigue   Currently tired due to acute  gastrointestinal infection.  Hydrate well.  Rest.  Work excuse was provided         Meds ordered this encounter  Medications   ciprofloxacin (CIPRO) 500 MG tablet    Sig: Take 1 tablet (500 mg total) by mouth 2 (two) times daily.    Dispense:  10 tablet    Refill:  0      Follow-up: Return in 2 weeks (on 06/20/2024) for f/u with PCP.  Marolyn Noel, MD

## 2024-06-06 NOTE — Assessment & Plan Note (Signed)
 Currently tired due to acute gastrointestinal infection.  Hydrate well.  Rest.  Work excuse was provided

## 2024-06-06 NOTE — Assessment & Plan Note (Signed)
 Recent, resolved.

## 2024-06-06 NOTE — Patient Instructions (Addendum)
 Use Imodium for diarrhea Tylenol  for pain Oral IV

## 2024-06-06 NOTE — Assessment & Plan Note (Addendum)
 Likely acute gastroenteritis, probably viral.  Take Imodium as needed.  Clear liquids, advance diet as tolerated I give her prescription for Cipro in case her symptoms relapsed Return to clinic if problems We will obtain sed rate, CBC, c-Met Back to work on Tuesday if okay

## 2024-06-08 ENCOUNTER — Ambulatory Visit: Payer: Self-pay | Admitting: Internal Medicine

## 2024-06-12 ENCOUNTER — Encounter: Payer: Self-pay | Admitting: Internal Medicine

## 2024-06-25 ENCOUNTER — Other Ambulatory Visit: Payer: Self-pay | Admitting: Internal Medicine

## 2024-06-26 ENCOUNTER — Other Ambulatory Visit: Payer: Self-pay

## 2024-07-23 ENCOUNTER — Other Ambulatory Visit: Payer: Self-pay

## 2024-07-23 ENCOUNTER — Other Ambulatory Visit: Payer: Self-pay | Admitting: Internal Medicine

## 2024-08-12 ENCOUNTER — Encounter: Payer: Self-pay | Admitting: Family Medicine

## 2024-08-12 ENCOUNTER — Ambulatory Visit: Admitting: Family Medicine

## 2024-08-12 VITALS — BP 128/80 | HR 112 | Temp 100.0°F | Resp 18 | Ht 66.0 in | Wt 248.0 lb

## 2024-08-12 DIAGNOSIS — J101 Influenza due to other identified influenza virus with other respiratory manifestations: Secondary | ICD-10-CM

## 2024-08-12 MED ORDER — OSELTAMIVIR PHOSPHATE 75 MG PO CAPS: 75.0000 mg | ORAL_CAPSULE | Freq: Two times a day (BID) | ORAL | 0 refills | Status: AC

## 2024-08-12 MED ORDER — PROMETHAZINE-DM 6.25-15 MG/5ML PO SYRP: 5.0000 mL | ORAL_SOLUTION | Freq: Four times a day (QID) | ORAL | 0 refills | Status: AC | PRN

## 2024-08-12 MED ORDER — PREDNISONE 20 MG PO TABS: 40.0000 mg | ORAL_TABLET | Freq: Every day | ORAL | 0 refills | Status: AC

## 2024-08-16 ENCOUNTER — Ambulatory Visit: Admitting: Internal Medicine

## 2024-08-17 ENCOUNTER — Encounter: Payer: Self-pay | Admitting: Family Medicine

## 2024-08-17 NOTE — Progress Notes (Signed)
 "  Assessment & Plan Influenza A Acute Influenza A infection with symptoms starting yesterday. Discussed Tamiflu  as an antiviral option within the first 48 hours of symptom onset. She opted to try Tamiflu  based on her son's positive experience. - Prescribed Tamiflu  twice daily for five days. - Advised she may increase acetaminophen  to 4,000 mg per day, using two 500 mg tablets up to four times a day and increase Ibuprofen  up to 800 mg three times per day as needed for pain and fever. - Suggested using DayQuil, NyQuil, or Tylenol  Cold and Flu for symptom management. - Continue using albuterol  as needed for wheezing and dyspnea.  - Recommended using a humidifier with liquid Vicks at night. - Provided a doctor's note excusing from work until Monday January 12th, contingent on being fever-free for 24 hours. - Education provided on influenza. Orders:   predniSONE  (DELTASONE ) 20 MG tablet; Take 2 tablets (40 mg total) by mouth daily for 5 days.   oseltamivir  (TAMIFLU ) 75 MG capsule; Take 1 capsule (75 mg total) by mouth 2 (two) times daily for 5 days.   promethazine -dextromethorphan (PROMETHAZINE -DM) 6.25-15 MG/5ML syrup; Take 5 mLs by mouth 4 (four) times daily as needed for cough.    Follow up plan: Return for chronic follow-up with NEW PCP.  Niki Rung, MSN, APRN, FNP-C  Subjective:  HPI: Kathleen Hendricks is a 44 y.o. female presenting on 08/12/2024 for Influenza (Son positive on 08/09/2024 - patient now has aches, fever, cough, congestion, runny nose - started yesterday )  Discussed the use of AI scribe software for clinical note transcription with the patient, who gave verbal consent to proceed.  She began experiencing flu-like symptoms yesterday, including body aches, fever, cough, congestion, and rhinorrhea. She also experienced wheezing and dyspnea, for which she used her albuterol  inhaler, taking two puffs, which provided relief. She has a history of wheezing and difficulty breathing,  particularly when climbing stairs.  She is currently taking Tylenol  and ibuprofen  for symptom relief, with doses of two 325 mg tablets of Tylenol  and 400 mg of ibuprofen . She has previously been prescribed promethazine  DM for cough management, particularly during a past episode of COVID-19 and strep throat in August. These medications help with her cough and allow her to sleep better.  Her son currently has the flu and has responded well to Tamiflu .       ROS: Negative unless specifically indicated above in HPI.   Relevant past medical history reviewed and updated as indicated.   Allergies and medications reviewed and updated.  Current Medications[1]  Allergies[2]  Objective:   BP 128/80   Pulse (!) 112   Temp 100 F (37.8 C)   Resp 18   Ht 5' 6 (1.676 m)   Wt 248 lb (112.5 kg)   LMP 07/29/2024 (Approximate)   SpO2 99%   BMI 40.03 kg/m    Physical Exam Vitals reviewed.  Constitutional:      General: She is not in acute distress.    Appearance: Normal appearance. She is morbidly obese. She is not ill-appearing, toxic-appearing or diaphoretic.  HENT:     Head: Normocephalic and atraumatic.     Right Ear: Tympanic membrane, ear canal and external ear normal. There is no impacted cerumen.     Left Ear: Tympanic membrane, ear canal and external ear normal. There is no impacted cerumen.     Nose: Congestion present. No rhinorrhea.     Right Sinus: No maxillary sinus tenderness or frontal sinus tenderness.  Left Sinus: No maxillary sinus tenderness or frontal sinus tenderness.     Mouth/Throat:     Mouth: Mucous membranes are moist.     Pharynx: Oropharynx is clear. No oropharyngeal exudate or posterior oropharyngeal erythema.  Eyes:     General: No scleral icterus.       Right eye: No discharge.        Left eye: No discharge.     Conjunctiva/sclera: Conjunctivae normal.  Cardiovascular:     Rate and Rhythm: Normal rate and regular rhythm.     Heart sounds: Normal  heart sounds. No murmur heard.    No friction rub. No gallop.  Pulmonary:     Effort: Pulmonary effort is normal. No respiratory distress.     Breath sounds: Normal breath sounds. No stridor. No wheezing, rhonchi or rales.  Musculoskeletal:        General: Normal range of motion.     Cervical back: Normal range of motion.  Lymphadenopathy:     Cervical: No cervical adenopathy.  Skin:    General: Skin is warm and dry.     Capillary Refill: Capillary refill takes less than 2 seconds.  Neurological:     General: No focal deficit present.     Mental Status: She is alert and oriented to person, place, and time. Mental status is at baseline.  Psychiatric:        Mood and Affect: Mood normal.        Behavior: Behavior normal.        Thought Content: Thought content normal.        Judgment: Judgment normal.            [1]  Current Outpatient Medications:    acetaminophen  (TYLENOL ) 500 MG tablet, Take 500 mg by mouth every 6 (six) hours as needed for mild pain., Disp: , Rfl:    albuterol  (VENTOLIN  HFA) 108 (90 Base) MCG/ACT inhaler, INHALE 2 PUFFS INTO THE LUNGS EVERY 6 HOURS AS NEEDED FOR WHEEZING OR SHORTNESS OF BREATH, Disp: 6.7 g, Rfl: 0   ALPRAZolam  (XANAX ) 0.5 MG tablet, Take 1 tablet (0.5 mg total) by mouth 2 (two) times daily as needed for anxiety., Disp: 60 tablet, Rfl: 1   cyclobenzaprine  (FLEXERIL ) 5 MG tablet, Take 1 tablet (5 mg total) by mouth 3 (three) times daily as needed., Disp: 40 tablet, Rfl: 1   fluticasone  (FLONASE ) 50 MCG/ACT nasal spray, Place 1 spray into both nostrils daily as needed., Disp: 18.2 mL, Rfl: 0   gabapentin  (NEURONTIN ) 100 MG capsule, Take 1 capsule (100 mg total) by mouth 3 (three) times daily., Disp: 90 capsule, Rfl: 3   oseltamivir  (TAMIFLU ) 75 MG capsule, Take 1 capsule (75 mg total) by mouth 2 (two) times daily for 5 days., Disp: 10 capsule, Rfl: 0   predniSONE  (DELTASONE ) 20 MG tablet, Take 2 tablets (40 mg total) by mouth daily for 5 days.,  Disp: 10 tablet, Rfl: 0   promethazine -dextromethorphan (PROMETHAZINE -DM) 6.25-15 MG/5ML syrup, Take 5 mLs by mouth 4 (four) times daily as needed for cough., Disp: 118 mL, Rfl: 0 [2] No Known Allergies  "
